# Patient Record
Sex: Female | Born: 1970 | Race: White | Hispanic: No | Marital: Married | Smoking: Current every day smoker
Health system: Southern US, Community
[De-identification: ages and names within clinical notes are randomized; demographics above are authoritative.]

## PROBLEM LIST (undated history)

## (undated) DIAGNOSIS — M1990 Unspecified osteoarthritis, unspecified site: Secondary | ICD-10-CM

## (undated) DIAGNOSIS — R251 Tremor, unspecified: Secondary | ICD-10-CM

## (undated) DIAGNOSIS — Z720 Tobacco use: Secondary | ICD-10-CM

## (undated) DIAGNOSIS — E7800 Pure hypercholesterolemia, unspecified: Secondary | ICD-10-CM

## (undated) DIAGNOSIS — K219 Gastro-esophageal reflux disease without esophagitis: Secondary | ICD-10-CM

## (undated) DIAGNOSIS — I10 Essential (primary) hypertension: Secondary | ICD-10-CM

## (undated) HISTORY — DX: Tremor, unspecified: R25.1

## (undated) HISTORY — PX: ECTOPIC PREGNANCY SURGERY: SHX613

## (undated) HISTORY — DX: Gastro-esophageal reflux disease without esophagitis: K21.9

## (undated) HISTORY — DX: Unspecified osteoarthritis, unspecified site: M19.90

## (undated) HISTORY — DX: Pure hypercholesterolemia, unspecified: E78.00

## (undated) HISTORY — DX: Tobacco use: Z72.0

---

## 1999-10-01 ENCOUNTER — Other Ambulatory Visit: Admission: RE | Admit: 1999-10-01 | Discharge: 1999-10-01 | Payer: Self-pay | Admitting: Obstetrics and Gynecology

## 2000-02-21 ENCOUNTER — Encounter: Payer: Self-pay | Admitting: Emergency Medicine

## 2000-02-21 ENCOUNTER — Emergency Department (HOSPITAL_COMMUNITY): Admission: EM | Admit: 2000-02-21 | Discharge: 2000-02-21 | Payer: Self-pay | Admitting: Emergency Medicine

## 2000-04-18 ENCOUNTER — Ambulatory Visit (HOSPITAL_COMMUNITY): Admission: RE | Admit: 2000-04-18 | Discharge: 2000-04-18 | Payer: Self-pay | Admitting: Internal Medicine

## 2000-10-15 ENCOUNTER — Encounter: Payer: Self-pay | Admitting: Obstetrics & Gynecology

## 2000-10-15 ENCOUNTER — Observation Stay (HOSPITAL_COMMUNITY): Admission: AD | Admit: 2000-10-15 | Discharge: 2000-10-16 | Payer: Self-pay | Admitting: Obstetrics & Gynecology

## 2001-03-16 ENCOUNTER — Other Ambulatory Visit: Admission: RE | Admit: 2001-03-16 | Discharge: 2001-03-16 | Payer: Self-pay | Admitting: Obstetrics and Gynecology

## 2002-07-10 ENCOUNTER — Inpatient Hospital Stay (HOSPITAL_COMMUNITY): Admission: AD | Admit: 2002-07-10 | Discharge: 2002-07-12 | Payer: Self-pay | Admitting: Obstetrics and Gynecology

## 2002-08-12 ENCOUNTER — Other Ambulatory Visit: Admission: RE | Admit: 2002-08-12 | Discharge: 2002-08-12 | Payer: Self-pay | Admitting: Obstetrics & Gynecology

## 2004-02-17 ENCOUNTER — Other Ambulatory Visit: Admission: RE | Admit: 2004-02-17 | Discharge: 2004-02-17 | Payer: Self-pay | Admitting: Obstetrics & Gynecology

## 2005-03-14 ENCOUNTER — Other Ambulatory Visit: Admission: RE | Admit: 2005-03-14 | Discharge: 2005-03-14 | Payer: Self-pay | Admitting: Obstetrics and Gynecology

## 2012-07-26 ENCOUNTER — Ambulatory Visit (INDEPENDENT_AMBULATORY_CARE_PROVIDER_SITE_OTHER): Payer: PRIVATE HEALTH INSURANCE | Admitting: Internal Medicine

## 2012-07-26 VITALS — BP 135/84 | HR 78 | Temp 98.2°F | Resp 16 | Ht 67.0 in | Wt 167.0 lb

## 2012-07-26 DIAGNOSIS — L0100 Impetigo, unspecified: Secondary | ICD-10-CM

## 2012-07-26 MED ORDER — DOXYCYCLINE HYCLATE 100 MG PO TABS: 100.0000 mg | ORAL_TABLET | Freq: Two times a day (BID) | ORAL | Status: AC

## 2012-07-26 NOTE — Progress Notes (Signed)
  Subjective:    Patient ID: Allison Wallace, female    DOB: 06/28/1971, 41 y.o.   MRN: [id number]  HPITender pustules emerging on face over the past week History of acne No mosquito bites or injuries to face No shaving    Review of Systems     Objective:   Physical Exam Pustules x6 on chin Nares clear No vesicles or ulcers       Assessment & Plan:  Problem #1 impetigo face Cultured Doxycycline 100 twice a day for 10 days

## 2012-07-29 LAB — WOUND CULTURE
Gram Stain: NONE SEEN
Organism ID, Bacteria: NO GROWTH

## 2015-08-11 ENCOUNTER — Other Ambulatory Visit: Payer: Self-pay | Admitting: Cardiology

## 2015-08-11 ENCOUNTER — Ambulatory Visit
Admission: RE | Admit: 2015-08-11 | Discharge: 2015-08-11 | Disposition: A | Discharge status: Home / Self Care | Payer: 59 | Source: Ambulatory Visit | Attending: Cardiology | Admitting: Cardiology

## 2015-08-11 DIAGNOSIS — R0789 Other chest pain: Secondary | ICD-10-CM

## 2016-03-02 IMAGING — CR DG CHEST 2V
2 series · 2 of 2 positions shown · non-contrast
Comparison: None.

CLINICAL DATA: Left upper anterior chest and left arm pain for 3
weeks. No injury.

EXAM:
CHEST  2 VIEW

[view not recorded (1 of 2)]
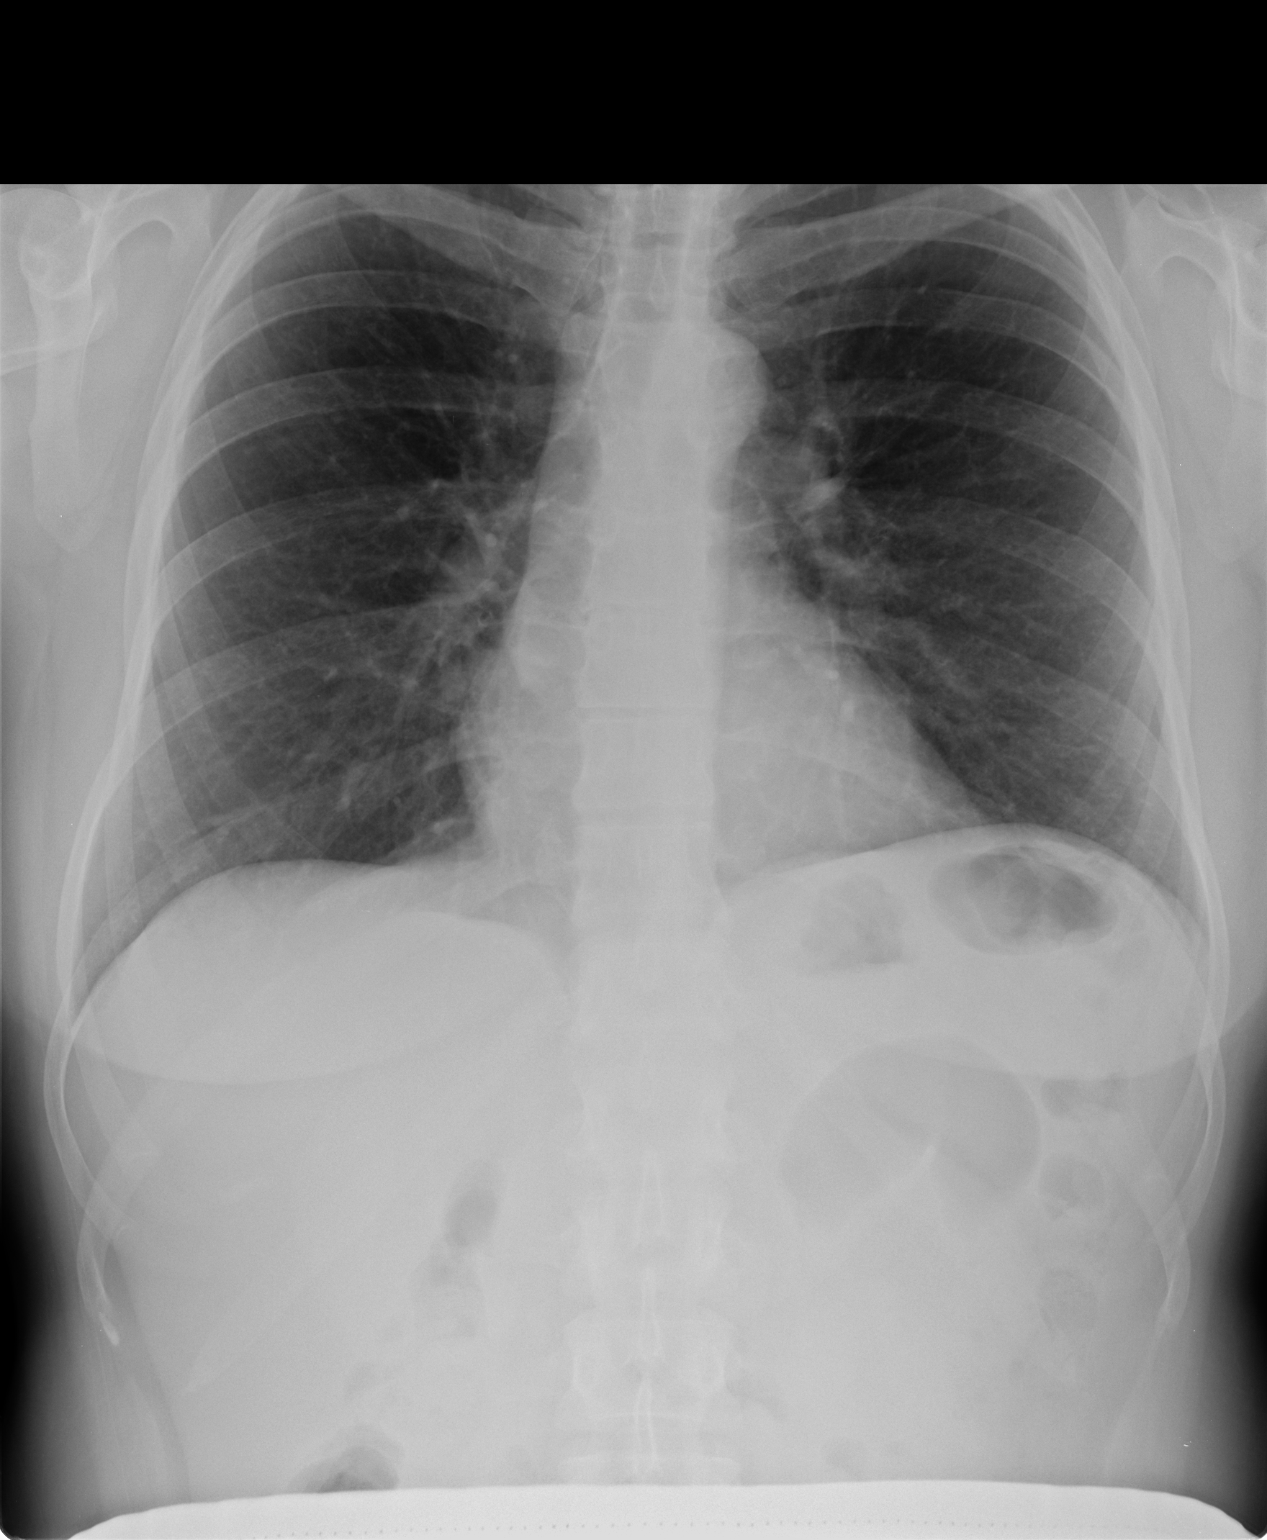

[view not recorded (2 of 2)]
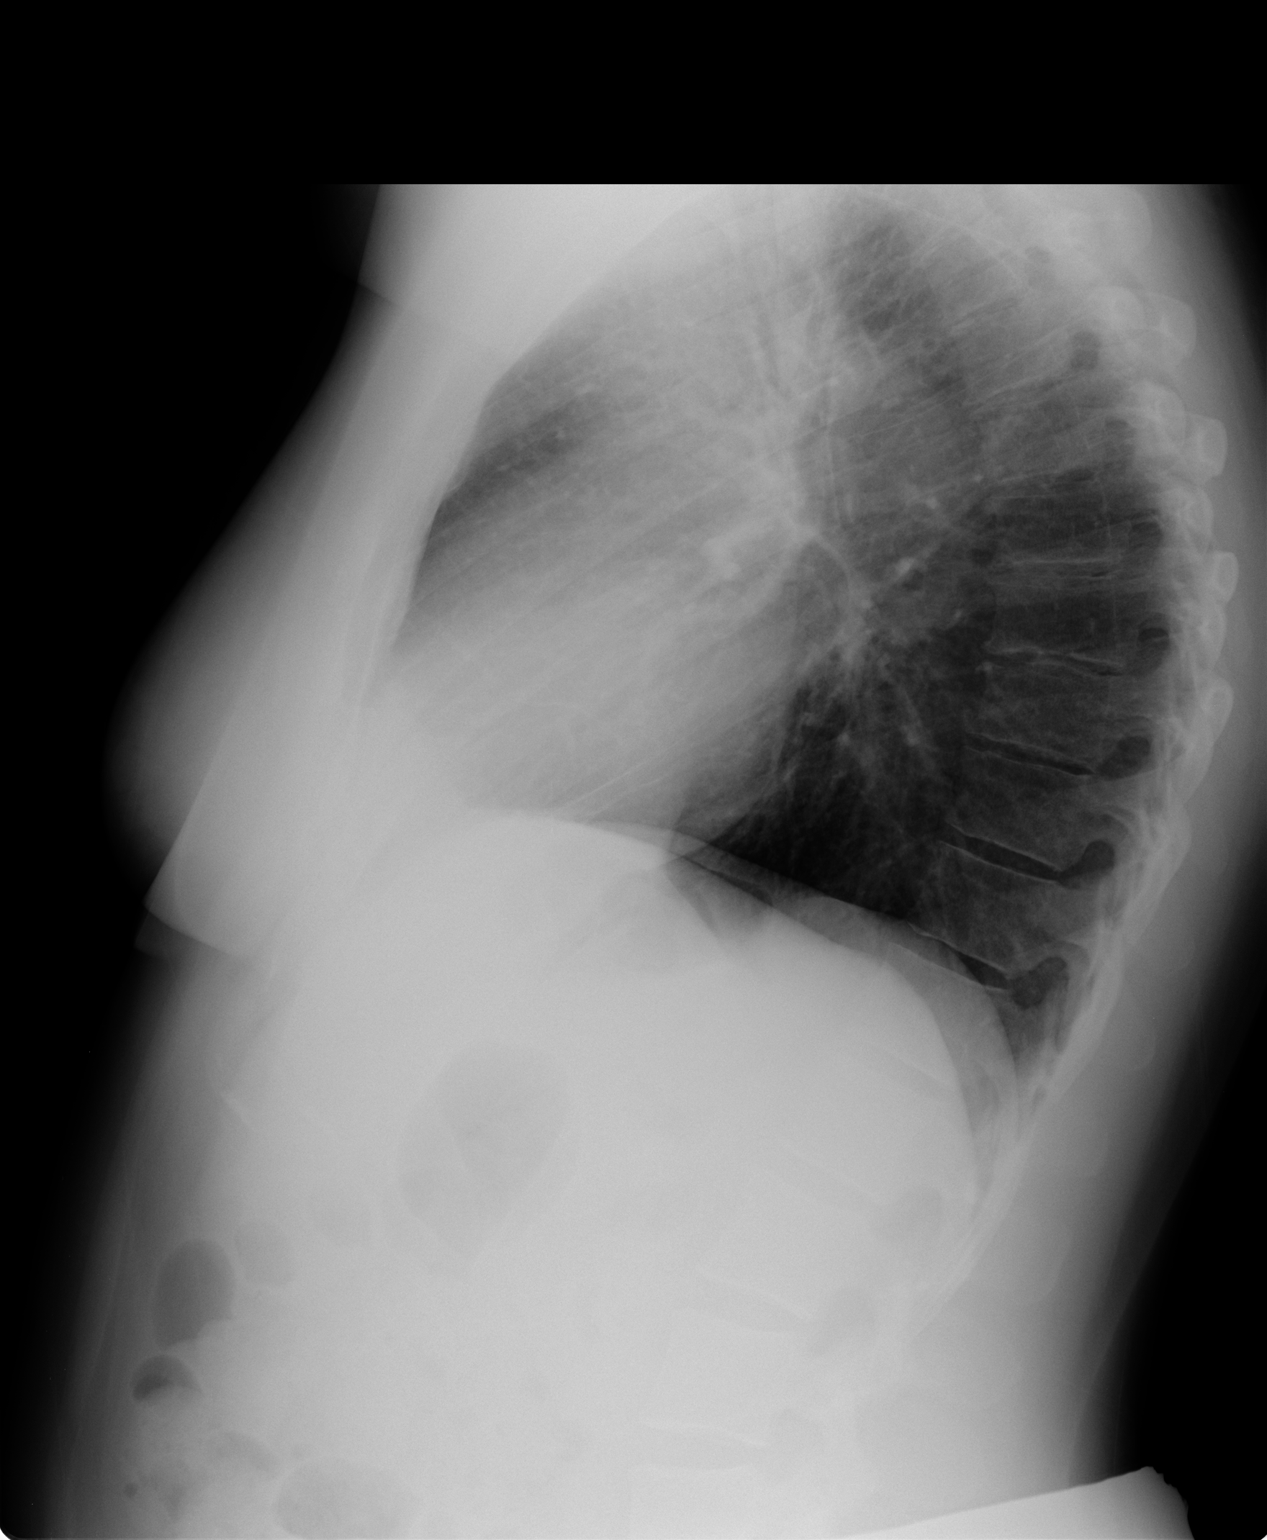

[2 of 2 positions shown; findings below may reference images not displayed]

FINDINGS: Cardiac silhouette normal in size and configuration. Normal
mediastinal and hilar contours. Clear lungs. No pleural effusion or
pneumothorax.

Minimal degenerative spurring along the mid thoracic spine. No other
skeletal abnormality.
IMPRESSION: No active cardiopulmonary disease.

## 2017-05-13 ENCOUNTER — Encounter: Payer: Self-pay | Admitting: Adult Health

## 2017-05-13 ENCOUNTER — Ambulatory Visit (INDEPENDENT_AMBULATORY_CARE_PROVIDER_SITE_OTHER): Payer: 59 | Admitting: Adult Health

## 2017-05-13 VITALS — BP 131/90 | HR 90 | Temp 98.4°F | Ht 67.5 in | Wt 172.3 lb

## 2017-05-13 DIAGNOSIS — F17200 Nicotine dependence, unspecified, uncomplicated: Secondary | ICD-10-CM

## 2017-05-13 DIAGNOSIS — Z0000 Encounter for general adult medical examination without abnormal findings: Secondary | ICD-10-CM | POA: Diagnosis not present

## 2017-05-13 DIAGNOSIS — R5383 Other fatigue: Secondary | ICD-10-CM | POA: Insufficient documentation

## 2017-05-13 DIAGNOSIS — Z8342 Family history of familial hypercholesterolemia: Secondary | ICD-10-CM

## 2017-05-13 NOTE — Assessment & Plan Note (Signed)
Increase water intake to at least 80 ounces/daily. Will check TSH, Vit d levels.

## 2017-05-13 NOTE — Patient Instructions (Signed)
Smoking Tobacco Information Smoking tobacco will very likely harm your health. Tobacco contains a poisonous (toxic), colorless chemical called nicotine. Nicotine affects the brain and makes tobacco addictive. This change in your brain can make it hard to stop smoking. Tobacco also has other toxic chemicals that can hurt your body and raise your risk of many cancers. How can smoking tobacco affect me? Smoking tobacco can increase your chances of having serious health conditions, such as:  Cancer. Smoking is most commonly associated with lung cancer, but can lead to cancer in other parts of the body.  Chronic obstructive pulmonary disease (COPD). This is a long-term lung condition that makes it hard to breathe. It also gets worse over time.  High blood pressure (hypertension), heart disease, stroke, or heart attack.  Lung infections, such as pneumonia.  Cataracts. This is when the lenses in the eyes become clouded.  Digestive problems. This may include peptic ulcers, heartburn, and gastroesophageal reflux disease (GERD).  Oral health problems, such as gum disease and tooth loss.  Loss of taste and smell. Smoking can affect your appearance by causing:  Wrinkles.  Yellow or stained teeth, fingers, and fingernails. Smoking tobacco can also affect your social life.  Many workplaces, [employer], hotels, and public places are tobacco-free. This means that you may experience challenges in finding places to smoke when away from home.  The cost of a smoking habit can be expensive. Expenses for someone who smokes come in two ways:  You spend money on a regular basis to buy tobacco.  Your health care costs in the long-term are higher if you smoke.  Tobacco smoke can also affect the health of those around you. Children of smokers have greater chances of:  Sudden infant death syndrome (SIDS).  Ear infections.  Lung infections. What lifestyle changes can be made?  Do not start smoking.  Quit if you already do.  To quit smoking:  Make a plan to quit smoking and commit yourself to it. Look for programs to help you and ask your health care provider for recommendations and ideas.  Talk with your health care provider about using nicotine replacement medicines to help you quit. Medicine replacement medicines include gum, lozenges, patches, sprays, or pills.  Do not replace cigarette smoking with electronic cigarettes, which are commonly called e-cigarettes. The safety of e-cigarettes is not known, and some may contain harmful chemicals.  Avoid places, people, or situations that tempt you to smoke.  If you try to quit but return to smoking, don't give up hope. It is very common for people to try a number of times before they fully succeed. When you feel ready again, give it another try.  Quitting smoking might affect the way you eat as well as your weight. Be prepared to monitor your eating habits. Get support in planning and following a healthy diet.  Ask your health care provider about having regular tests (screenings) to check for cancer. This may include blood tests, imaging tests, and other tests.  Exercise regularly. Consider taking walks, joining a gym, or doing yoga or exercise classes.  Develop skills to manage your stress. These skills include meditation. What are the benefits of quitting smoking? By quitting smoking, you may:  Lower your risk of getting cancer and other diseases caused by smoking.  Live longer.  Breathe better.  Lower your blood pressure and heart rate.  Stop your addiction to tobacco.  Stop creating secondhand smoke that hurts other people.  Improve your sense of taste  and smell.  Look better over time, due to having fewer wrinkles and less staining. What can happen if changes are not made? If you do not stop smoking, you may:  Get cancer and other diseases.  Develop COPD or other long-term (chronic) lung conditions.  Develop  serious problems with your heart and blood vessels (cardiovascular system).  Need more tests to screen for problems caused by smoking.  Have higher, long-term healthcare costs from medicines or treatments related to smoking.  Continue to have worsening changes in your lungs, mouth, and nose. Where to find support: To get support to quit smoking, consider:  Asking your health care provider for more information and resources.  Taking classes to learn more about quitting smoking.  Looking for local organizations that offer resources about quitting smoking.  Joining a support group for people who want to quit smoking in your local community. Where to find more information: You may find more information about quitting smoking from:  HelpGuide.org: www.helpguide.org/articles/addictions/how-to-quit-smoking.htm  https://hall.com/: smokefree.gov  American Lung Association: www.lung.org Contact a health care provider if:  You have problems breathing.  Your lips, nose, or fingers turn blue.  You have chest pain.  You are coughing up blood.  You feel faint or you pass out.  You have other noticeable changes that cause you to worry. Summary  Smoking tobacco can negatively affect your health, the health of those around you, your finances, and your social life.  Do not start smoking. Quit if you already do. If you need help quitting, ask your health care provider.  Think about joining a support group for people who want to quit smoking in your local community. There are many effective programs that will help you to quit this behavior. This information is not intended to replace advice given to you by your health care provider. Make sure you discuss any questions you have with your health care provider. Document Released: 12/31/2016 Document Revised: 12/31/2016 Document Reviewed: 12/31/2016 Elsevier Interactive Patient Education  2017 [employer].  Please consider quitting tobacco  use. Increase water intake to at least 80 ounces daily. Increase daily movement to at least 59mins/5 times a week. Please schedule fasting labs and complete physical examination at your convenience.  Annual follow-up, sooner if needed.

## 2017-05-13 NOTE — Progress Notes (Signed)
Subjective:    Patient ID: Allison Wallace, female    DOB: [birth year], 46 y.o.   MRN: [id number]  HPI:  [name] presents to establish as a new pt.  She is a very pleasant 46 year old female.  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 She is married and has two boys ages 18 and 3.  She estimates to drink 1-3 glasses of wine a night.  She denies any acute complaints. She was recently followed by Dr. Pietro Cassis Care.   Annual/PRN Dermatology care by Dr. Nevada Crane. Of Note: last mammogram 2017 that showed "cystic breasts".  She plans on following up with GYN for f/u mammogram.  No care team member to display  There are no active problems to display for this patient.    History reviewed. No pertinent past medical history.   History reviewed. No pertinent surgical history.   Family History  Problem Relation Age of Onset  . Hyperlipidemia Father      History  Drug Use No     History  Alcohol Use  . 2.4 oz/week  . 4 Glasses of wine per week     History  Smoking Status  . Current Every Day Smoker  . Types: Cigarettes  Smokeless Tobacco  . Current User    Comment: 1 pack every 2 days     Outpatient Encounter Prescriptions as of 05/13/2017  Medication Sig  . Ivermectin (SOOLANTRA) 1 % CREA Apply topically.  . Minocycline HCl (SOLODYN) 115 MG TB24 Solodyn 115 mg tablet,extended release  TAKE 1 TABLET BY MOUTH EVERY DAY WITH FOOD AND WATER, (SUN WARNING)  . Multiple Vitamin (ONE-A-DAY ADULT VITACRAVES+DHA) CHEW Chew by mouth.  . [DISCONTINUED] vitamin A 10000 UNIT capsule Take 10,000 Units by mouth daily.   No facility-administered encounter medications on file as of 05/13/2017.     Allergies: Patient has no known allergies.  Body mass index is 26.59 kg/m.  Blood pressure 131/90, pulse 90, temperature 98.4 F (36.9  C), height 5' 7.5" (1.715 m), weight 172 lb 4.8 oz (78.2 kg), SpO2 97 %.  Review of Systems  Constitutional: Positive for fatigue. Negative for activity change, appetite change, chills, diaphoresis, fever and unexpected weight change.  HENT: Negative for congestion.   Eyes: Negative for visual disturbance.  Respiratory: Negative for cough, chest tightness, shortness of breath, wheezing and stridor.   Cardiovascular: Negative for chest pain, palpitations and leg swelling.  Gastrointestinal: Negative for abdominal distention, abdominal pain, blood in stool, constipation, diarrhea, nausea and vomiting.  Endocrine: Negative for cold intolerance, heat intolerance, polydipsia, polyphagia and polyuria.  Genitourinary: Negative for difficulty urinating, flank pain and hematuria.  Musculoskeletal: Negative for arthralgias, back pain, gait problem, joint swelling, myalgias, neck pain and neck stiffness.  Skin: Negative for color change, pallor, rash and wound.  Allergic/Immunologic: Negative for immunocompromised state.  Neurological: Negative for dizziness, tremors, weakness and headaches.  Hematological: Does not bruise/bleed easily.  Psychiatric/Behavioral: Negative for agitation, dysphoric mood and sleep disturbance. The patient is not nervous/anxious.        Objective:   Physical Exam  Constitutional: She is oriented to person, place, and time. She appears well-developed and well-nourished. No distress.  HENT:  Head: Normocephalic and atraumatic.  Right Ear: Hearing, tympanic membrane, external ear and ear canal normal. No decreased hearing is noted.  Left Ear: Tympanic membrane, external  ear and ear canal normal. No decreased hearing is noted.  Nose: Nose normal. Right sinus exhibits no maxillary sinus tenderness and no frontal sinus tenderness. Left sinus exhibits no maxillary sinus tenderness and no frontal sinus tenderness.  Mouth/Throat: Uvula is midline.  Eyes: Conjunctivae are normal.  Pupils are equal, round, and reactive to light.  Neck: Normal range of motion. Neck supple.  Cardiovascular: Normal rate, regular rhythm, normal heart sounds and intact distal pulses.   No murmur heard. Pulmonary/Chest: Effort normal and breath sounds normal. No respiratory distress. She has no wheezes. She has no rales. She exhibits no tenderness.  Abdominal: Soft. Bowel sounds are normal. She exhibits no distension and no mass. There is no tenderness. There is no rebound and no guarding.  Musculoskeletal: Normal range of motion.  Lymphadenopathy:    She has no cervical adenopathy.  Neurological: She is alert and oriented to person, place, and time. Coordination normal.  Skin: Skin is warm and dry. Rash noted. Rash is nodular. She is not diaphoretic. No erythema. No pallor. Nails show clubbing.  Multiple discrete lipomas on bil upper extremities.   Psychiatric: She has a normal mood and affect. Her behavior is normal. Judgment and thought content normal.  Nursing note and vitals reviewed.         Assessment & Plan:   1. Family history of high cholesterol   2. Fatigue, unspecified type   3. Health care maintenance   4. Tobacco use disorder   5. Healthcare maintenance     Family history of high cholesterol Will check fasting lipid panel. Follow heart healthy diet.  Fatigue Increase water intake to at least 80 ounces/daily. Will check TSH, Vit d levels.  Healthcare maintenance Increase water intake to at least 80 ounces/daily. Increase regular movement to at least 35mins/5 times daily. Declined tobacco cessation today. Decrease ETOH use to max 1 glass wine/day. Follow Heart Healthy Diet Please schedule fasting labs and CPE at your convenience.     FOLLOW-UP:  Return in about 1 year (around 05/13/2018) for Regular Follow Up.

## 2017-05-13 NOTE — Assessment & Plan Note (Signed)
Will check fasting lipid panel. Follow heart healthy diet.

## 2017-05-13 NOTE — Assessment & Plan Note (Signed)
Increase water intake to at least 80 ounces/daily. Increase regular movement to at least 60mins/5 times daily. Declined tobacco cessation today. Decrease ETOH use to max 1 glass wine/day. Follow Heart Healthy Diet Please schedule fasting labs and CPE at your convenience.

## 2018-06-29 ENCOUNTER — Other Ambulatory Visit: Payer: Managed Care, Other (non HMO)

## 2018-06-29 DIAGNOSIS — Z0000 Encounter for general adult medical examination without abnormal findings: Secondary | ICD-10-CM

## 2018-06-30 LAB — CBC WITH DIFFERENTIAL/PLATELET
BASOS: 0 %
Basophils Absolute: 0 10*3/uL (ref 0.0–0.2)
EOS (ABSOLUTE): 0.1 10*3/uL (ref 0.0–0.4)
EOS: 1 %
HEMOGLOBIN: 16.2 g/dL — AB (ref 11.1–15.9)
Hematocrit: 46.5 % (ref 34.0–46.6)
IMMATURE GRANS (ABS): 0 10*3/uL (ref 0.0–0.1)
IMMATURE GRANULOCYTES: 0 %
LYMPHS ABS: 2 10*3/uL (ref 0.7–3.1)
Lymphs: 26 %
MCH: 37 pg — AB (ref 26.6–33.0)
MCHC: 34.8 g/dL (ref 31.5–35.7)
MCV: 106 fL — AB (ref 79–97)
MONOS ABS: 0.6 10*3/uL (ref 0.1–0.9)
Monocytes: 8 %
NEUTROS ABS: 5.1 10*3/uL (ref 1.4–7.0)
Neutrophils: 65 %
Platelets: 286 10*3/uL (ref 150–450)
RBC: 4.38 x10E6/uL (ref 3.77–5.28)
RDW: 13 % (ref 12.3–15.4)
WBC: 7.9 10*3/uL (ref 3.4–10.8)

## 2018-06-30 LAB — COMPREHENSIVE METABOLIC PANEL
ALK PHOS: 81 IU/L (ref 39–117)
ALT: 20 IU/L (ref 0–32)
AST: 21 IU/L (ref 0–40)
Albumin/Globulin Ratio: 1.7 (ref 1.2–2.2)
Albumin: 3.9 g/dL (ref 3.5–5.5)
BILIRUBIN TOTAL: 0.5 mg/dL (ref 0.0–1.2)
BUN/Creatinine Ratio: 11 (ref 9–23)
BUN: 9 mg/dL (ref 6–24)
CHLORIDE: 109 mmol/L — AB (ref 96–106)
CO2: 24 mmol/L (ref 20–29)
CREATININE: 0.8 mg/dL (ref 0.57–1.00)
Calcium: 9.1 mg/dL (ref 8.7–10.2)
GFR calc Af Amer: 102 mL/min/{1.73_m2} (ref >59)
GFR calc non Af Amer: 88 mL/min/{1.73_m2} (ref >59)
GLUCOSE: 77 mg/dL (ref 65–99)
Globulin, Total: 2.3 g/dL (ref 1.5–4.5)
Potassium: 4.6 mmol/L (ref 3.5–5.2)
SODIUM: 144 mmol/L (ref 134–144)
Total Protein: 6.2 g/dL (ref 6.0–8.5)

## 2018-06-30 LAB — LIPID PANEL
Chol/HDL Ratio: 3.8 ratio (ref 0.0–4.4)
Cholesterol, Total: 189 mg/dL (ref 100–199)
HDL: 50 mg/dL (ref >39)
LDL CALC: 116 mg/dL — AB (ref 0–99)
TRIGLYCERIDES: 117 mg/dL (ref 0–149)
VLDL CHOLESTEROL CAL: 23 mg/dL (ref 5–40)

## 2018-06-30 LAB — HEMOGLOBIN A1C
ESTIMATED AVERAGE GLUCOSE: 94 mg/dL
Hgb A1c MFr Bld: 4.9 % (ref 4.8–5.6)

## 2018-06-30 LAB — TSH: TSH: 1.73 u[IU]/mL (ref 0.450–4.500)

## 2018-07-06 ENCOUNTER — Encounter: Payer: Self-pay | Admitting: Adult Health

## 2018-07-06 ENCOUNTER — Ambulatory Visit (INDEPENDENT_AMBULATORY_CARE_PROVIDER_SITE_OTHER): Payer: Managed Care, Other (non HMO) | Admitting: Adult Health

## 2018-07-06 VITALS — BP 138/85 | HR 85 | Ht 67.5 in | Wt 171.6 lb

## 2018-07-06 DIAGNOSIS — E7800 Pure hypercholesterolemia, unspecified: Secondary | ICD-10-CM | POA: Diagnosis not present

## 2018-07-06 DIAGNOSIS — R7989 Other specified abnormal findings of blood chemistry: Secondary | ICD-10-CM | POA: Insufficient documentation

## 2018-07-06 DIAGNOSIS — D179 Benign lipomatous neoplasm, unspecified: Secondary | ICD-10-CM | POA: Diagnosis not present

## 2018-07-06 DIAGNOSIS — E782 Mixed hyperlipidemia: Secondary | ICD-10-CM

## 2018-07-06 DIAGNOSIS — Z8342 Family history of familial hypercholesterolemia: Secondary | ICD-10-CM | POA: Diagnosis not present

## 2018-07-06 DIAGNOSIS — Z0000 Encounter for general adult medical examination without abnormal findings: Secondary | ICD-10-CM | POA: Diagnosis not present

## 2018-07-06 DIAGNOSIS — Z23 Encounter for immunization: Secondary | ICD-10-CM

## 2018-07-06 DIAGNOSIS — R251 Tremor, unspecified: Secondary | ICD-10-CM | POA: Diagnosis not present

## 2018-07-06 HISTORY — DX: Mixed hyperlipidemia: E78.2

## 2018-07-06 NOTE — Assessment & Plan Note (Signed)
Stable If sx's worsen or affect daily life- call clinic

## 2018-07-06 NOTE — Assessment & Plan Note (Addendum)
7/19 Hemoglobin- 16.2 MCV- 106 MCH- 37 She reports hx of anemia during pregnancy, denies excessive fatigue currently Will repeat CBC in 3 months

## 2018-07-06 NOTE — Assessment & Plan Note (Signed)
Increase water intake, strive for at least 85 ounces/day.   Follow Heart Healthy diet Increase regular exercise.  Recommend at least 30 minutes daily, 5 days per week of walking, jogging, biking, swimming, YouTube/Pinterest workout videos. Your recent labs look good, recommend repeating CBC in 3 months to check for anemia. Referral to General Surgery placed, re: enlarging upper extremity lipomas. Reduce tobacco and alcohol use. Please schedule lab appt in 3 months, re: CBC repeat Please schedule complete physical with fasting labs in 1 year.

## 2018-07-06 NOTE — Progress Notes (Signed)
Subjective:    Patient ID: Allison Wallace, female    DOB: 10/26/1971, 47 y.o.   MRN: [id number]  HPI:  05/13/17 OV: Allison Wallace presents to establish as a new pt.  Allison Wallace is a very pleasant 47 year old female.  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 Allison Wallace is married and has two boys ages 6 and 103.  Allison Wallace estimates to drink 1-3 glasses of wine a night.  Allison Wallace denies any acute complaints. Allison Wallace was recently followed by Dr. Pietro Cassis Care.   Annual/PRN Dermatology care by Dr. Nevada Crane. Of Note: last mammogram 2017 that showed "cystic breasts".  Allison Wallace plans on following up with GYN for f/u mammogram.  07/06/18 OV: Allison Wallace presents for CPE Allison Wallace estimates to drink 16-20 oz water/day, prefers to hydrate with sweat tea. Allison Wallace denies regular exercise, however has [employer] Allison Wallace reports walking frequently at work. Allison Wallace continues to use tobacco- pack/day, declined smoking cessation today Allison Wallace reports increase in size/nnumber of upper extremity lipomas Allison Wallace reports tremor of head and LLE for "at least several years" and reports an aunt and her son with similar sx's Allison Wallace reports drinking 3-4 glasses wine/night Allison Wallace denies ETOH interfering with work responsibilities or interpersonal relationships. Allison Wallace denies hx of DUIs  Reviewed most recent labs, notables: LDL- 116 CBC- minor bump in hemoglobin, MCV, MCH  Healthcare Maintenance: PAP-UTD, will request from OB/GYN Mammogram- pt will schedule Immunizations- Tdap updated today  Patient Care Team    Relationship Specialty Notifications Start End  Mina Marble D, NP PCP - General Family Medicine  05/13/17   Allyn Kenner, MD  Dermatology  05/13/17   Olga Millers, MD Consulting Physician Obstetrics and Gynecology  05/13/17     Patient Active Problem List   Diagnosis Date Noted  . Elevated LDL cholesterol level  07/06/2018  . Abnormal CBC 07/06/2018  . 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 Tremor of unknown origin 07/06/2018  . Family history of high cholesterol 05/13/2017  . Fatigue 05/13/2017  . Healthcare maintenance 05/13/2017  . Tobacco use disorder 05/13/2017     History reviewed. No pertinent past medical history.   History reviewed. No pertinent surgical history.   Family History  Problem Relation Age of Onset  . Hyperlipidemia Father      Social History   Substance and Sexual Activity  Drug Use No     Social History   Substance and Sexual Activity  Alcohol Use Yes  . Alcohol/week: 2.4 oz  . Types: 4 Glasses of wine per week     Social History   Tobacco Use  Smoking Status Current Every Day Smoker  . Types: Cigarettes  Smokeless Tobacco Current User  Tobacco Comment   1 pack every 2 days     Outpatient Encounter Medications as of 07/06/2018  Medication Sig  . Ivermectin (SOOLANTRA) 1 % CREA Apply topically.  . Minocycline HCl (SOLODYN) 115 MG TB24 Solodyn 115 mg tablet,extended release  TAKE 1 TABLET BY MOUTH EVERY DAY WITH FOOD AND WATER, (SUN WARNING)  . Multiple Vitamin (ONE-A-DAY ADULT VITACRAVES+DHA) CHEW Chew by mouth.   No facility-administered encounter medications on file as of 07/06/2018.     Allergies: Patient has no known allergies.  Body mass index is 26.48 kg/m.  Blood pressure 138/85, pulse 85, height 5' 7.5" (1.715 m), weight 171 lb 9.6 oz (77.8 kg), SpO2 98 %.  Review of Systems  Constitutional: Positive for fatigue. Negative for activity change, appetite change, chills, diaphoresis, fever and unexpected weight change.  HENT: Negative for congestion.   Eyes: Negative for visual disturbance.  Respiratory: Negative for cough, chest tightness, shortness of breath, wheezing and stridor.   Cardiovascular: Negative for chest pain, palpitations and leg swelling.  Gastrointestinal: Negative for abdominal distention, abdominal pain, blood in  stool, constipation, diarrhea, nausea and vomiting.  Endocrine: Negative for cold intolerance, heat intolerance, polydipsia, polyphagia and polyuria.  Genitourinary: Negative for difficulty urinating, flank pain and hematuria.  Musculoskeletal: Negative for arthralgias, back pain, gait problem, joint swelling, myalgias, neck pain and neck stiffness.  Skin: Negative for color change, pallor, rash and wound.       Increase in size/number of upper extremity lipomas  Allergic/Immunologic: Negative for immunocompromised state.  Neurological: Negative for dizziness, tremors, weakness and headaches.  Hematological: Does not bruise/bleed easily.  Psychiatric/Behavioral: Negative for agitation, dysphoric mood and sleep disturbance. The patient is not nervous/anxious.        Objective:   Physical Exam  Constitutional: Allison Wallace is oriented to person, place, and time. Allison Wallace appears well-developed and well-nourished. No distress.  HENT:  Head: Normocephalic and atraumatic.  Right Ear: Hearing, tympanic membrane, external ear and ear canal normal. No decreased hearing is noted.  Left Ear: Tympanic membrane, external ear and ear canal normal. No decreased hearing is noted.  Nose: Nose normal. Right sinus exhibits no maxillary sinus tenderness and no frontal sinus tenderness. Left sinus exhibits no maxillary sinus tenderness and no frontal sinus tenderness.  Mouth/Throat: Uvula is midline.  Eyes: Pupils are equal, round, and reactive to light. Conjunctivae are normal.  Neck: Normal range of motion. Neck supple.  Cardiovascular: Normal rate, regular rhythm, normal heart sounds and intact distal pulses.  No murmur heard. Pulmonary/Chest: Effort normal and breath sounds normal. No respiratory distress. Allison Wallace has no decreased breath sounds. Allison Wallace has no wheezes. Allison Wallace has no rhonchi. Allison Wallace has no rales. Allison Wallace exhibits no tenderness. Right breast exhibits no inverted nipple, no mass, no nipple discharge, no skin change and no  tenderness. Left breast exhibits no inverted nipple, no mass, no nipple discharge, no skin change and no tenderness.  Abdominal: Soft. Bowel sounds are normal. Allison Wallace exhibits no distension and no mass. There is no tenderness. There is no rebound and no guarding.  Musculoskeletal: Normal range of motion. Allison Wallace exhibits no edema or tenderness.  Lymphadenopathy:    Allison Wallace has no cervical adenopathy.  Neurological: Allison Wallace is alert and oriented to person, place, and time. Allison Wallace displays tremor. Coordination and gait normal.  Fine tremor of head and LLE   Skin: Skin is warm, dry and intact. Rash noted. Allison Wallace is not diaphoretic. No erythema. No pallor. Nails show clubbing.  Multiple discrete lipomas on bil upper extremities.   Psychiatric: Allison Wallace has a normal mood and affect. Her behavior is normal. Judgment and thought content normal.  Nursing note and vitals reviewed.     Assessment & Plan:   1. Healthcare maintenance   2. Family history of high cholesterol   3. Elevated LDL cholesterol level   4. Abnormal CBC   5. Need for Tdap vaccination   6. Multiple lipomas   7. Tremor of unknown origin     Abnormal CBC 7/19 Hemoglobin- 16.2 MCV- 106 MCH- 37 Allison Wallace reports hx of anemia during pregnancy, denies excessive fatigue currently Will repeat CBC in 3 months   Healthcare maintenance Increase water intake, strive for at least 85  ounces/day.   Follow Heart Healthy diet Increase regular exercise.  Recommend at least 30 minutes daily, 5 days per week of walking, jogging, biking, swimming, YouTube/Pinterest workout videos. Your recent labs look good, recommend repeating CBC in 3 months to check for anemia. Referral to General Surgery placed, re: enlarging upper extremity lipomas. Reduce tobacco and alcohol use. Please schedule lab appt in 3 months, re: CBC repeat Please schedule complete physical with fasting labs in 1 year.  Tremor of unknown origin Stable If sx's worsen or affect daily life- call  clinic  FOLLOW-UP:  Return in about 1 year (around 07/07/2019) for CPE, Fasting Labs.

## 2018-07-06 NOTE — Patient Instructions (Addendum)
Preventive Care for Adults, Female  A healthy lifestyle and preventive care can promote health and wellness. Preventive health guidelines for women include the following key practices.   A routine yearly physical is a good way to check with your health care provider about your health and preventive screening. It is a chance to share any concerns and updates on your health and to receive a thorough exam.   Visit your dentist for a routine exam and preventive care every 6 months. Brush your teeth twice a day and floss once a day. Good oral hygiene prevents tooth decay and gum disease.   The frequency of eye exams is based on your age, health, family medical history, use of contact lenses, and other factors. Follow your health care provider's recommendations for frequency of eye exams.   Eat a healthy diet. Foods like vegetables, fruits, whole grains, low-fat dairy products, and lean protein foods contain the nutrients you need without too many calories. Decrease your intake of foods high in solid fats, added sugars, and salt. Eat the right amount of calories for you.Get information about a proper diet from your health care provider, if necessary.   Regular physical exercise is one of the most important things you can do for your health. Most adults should get at least 150 minutes of moderate-intensity exercise (any activity that increases your heart rate and causes you to sweat) each week. In addition, most adults need muscle-strengthening exercises on 2 or more days a week.   Maintain a healthy weight. The body mass index (BMI) is a screening tool to identify possible weight problems. It provides an estimate of body fat based on height and weight. Your health care provider can find your BMI, and can help you achieve or maintain a healthy weight.For adults 20 years and older:   - A BMI below 18.5 is considered underweight.   - A BMI of 18.5 to 24.9 is normal.   - A BMI of 25 to 29.9 is  considered overweight.   - A BMI of 30 and above is considered obese.   Maintain normal blood lipids and cholesterol levels by exercising and minimizing your intake of trans and saturated fats.  Eat a balanced diet with plenty of fruit and vegetables. Blood tests for lipids and cholesterol should begin at age 20 and be repeated every 5 years minimum.  If your lipid or cholesterol levels are high, you are over 40, or you are at high risk for heart disease, you may need your cholesterol levels checked more frequently.Ongoing high lipid and cholesterol levels should be treated with medicines if diet and exercise are not working.   If you smoke, find out from your health care provider how to quit. If you do not use tobacco, do not start.   Lung cancer screening is recommended for adults aged 55-80 years who are at high risk for developing lung cancer because of a history of smoking. A yearly low-dose CT scan of the lungs is recommended for people who have at least a 30-pack-year history of smoking and are a current smoker or have quit within the past 15 years. A pack year of smoking is smoking an average of 1 pack of cigarettes a day for 1 year (for example: 1 pack a day for 30 years or 2 packs a day for 15 years). Yearly screening should continue until the smoker has stopped smoking for at least 15 years. Yearly screening should be stopped for people who develop a   health problem that would prevent them from having lung cancer treatment.   If you are pregnant, do not drink alcohol. If you are breastfeeding, be very cautious about drinking alcohol. If you are not pregnant and choose to drink alcohol, do not have more than 1 drink per day. One drink is considered to be 12 ounces (355 mL) of beer, 5 ounces (148 mL) of wine, or 1.5 ounces (44 mL) of liquor.   Avoid use of street drugs. Do not share needles with anyone. Ask for help if you need support or instructions about stopping the use of  drugs.   High blood pressure causes heart disease and increases the risk of stroke. Your blood pressure should be checked at least yearly.  Ongoing high blood pressure should be treated with medicines if weight loss and exercise do not work.   If you are 69-55 years old, ask your health care provider if you should take aspirin to prevent strokes.   Diabetes screening involves taking a blood sample to check your fasting blood sugar level. This should be done once every 3 years, after age 38, if you are within normal weight and without risk factors for diabetes. Testing should be considered at a younger age or be carried out more frequently if you are overweight and have at least 1 risk factor for diabetes.   Breast cancer screening is essential preventive care for women. You should practice "breast self-awareness."  This means understanding the normal appearance and feel of your breasts and may include breast self-examination.  Any changes detected, no matter how small, should be reported to a health care provider.  Women in their 80s and 30s should have a clinical breast exam (CBE) by a health care provider as part of a regular health exam every 1 to 3 years.  After age 66, women should have a CBE every year.  Starting at age 1, women should consider having a mammogram (breast X-ray test) every year.  Women who have a family history of breast cancer should talk to their health care provider about genetic screening.  Women at a high risk of breast cancer should talk to their health care providers about having an MRI and a mammogram every year.   -Breast cancer gene (BRCA)-related cancer risk assessment is recommended for women who have family members with BRCA-related cancers. BRCA-related cancers include breast, ovarian, tubal, and peritoneal cancers. Having family members with these cancers may be associated with an increased risk for harmful changes (mutations) in the breast cancer genes BRCA1 and  BRCA2. Results of the assessment will determine the need for genetic counseling and BRCA1 and BRCA2 testing.   The Pap test is a screening test for cervical cancer. A Pap test can show cell changes on the cervix that might become cervical cancer if left untreated. A Pap test is a procedure in which cells are obtained and examined from the lower end of the uterus (cervix).   - Women should have a Pap test starting at age 57.   - Between ages 90 and 70, Pap tests should be repeated every 2 years.   - Beginning at age 63, you should have a Pap test every 3 years as long as the past 3 Pap tests have been normal.   - Some women have medical problems that increase the chance of getting cervical cancer. Talk to your health care provider about these problems. It is especially important to talk to your health care provider if a  new problem develops soon after your last Pap test. In these cases, your health care provider may recommend more frequent screening and Pap tests.   - The above recommendations are the same for women who have or have not gotten the vaccine for human papillomavirus (HPV).   - If you had a hysterectomy for a problem that was not cancer or a condition that could lead to cancer, then you no longer need Pap tests. Even if you no longer need a Pap test, a regular exam is a good idea to make sure no other problems are starting.   - If you are between ages 36 and 66 years, and you have had normal Pap tests going back 10 years, you no longer need Pap tests. Even if you no longer need a Pap test, a regular exam is a good idea to make sure no other problems are starting.   - If you have had past treatment for cervical cancer or a condition that could lead to cancer, you need Pap tests and screening for cancer for at least 20 years after your treatment.   - If Pap tests have been discontinued, risk factors (such as a new sexual partner) need to be reassessed to determine if screening should  be resumed.   - The HPV test is an additional test that may be used for cervical cancer screening. The HPV test looks for the virus that can cause the cell changes on the cervix. The cells collected during the Pap test can be tested for HPV. The HPV test could be used to screen women aged 70 years and older, and should be used in women of any age who have unclear Pap test results. After the age of 67, women should have HPV testing at the same frequency as a Pap test.   Colorectal cancer can be detected and often prevented. Most routine colorectal cancer screening begins at the age of 57 years and continues through age 26 years. However, your health care provider may recommend screening at an earlier age if you have risk factors for colon cancer. On a yearly basis, your health care provider may provide home test kits to check for hidden blood in the stool.  Use of a small camera at the end of a tube, to directly examine the colon (sigmoidoscopy or colonoscopy), can detect the earliest forms of colorectal cancer. Talk to your health care provider about this at age 23, when routine screening begins. Direct exam of the colon should be repeated every 5 -10 years through age 49 years, unless early forms of pre-cancerous polyps or small growths are found.   People who are at an increased risk for hepatitis B should be screened for this virus. You are considered at high risk for hepatitis B if:  -You were born in a country where hepatitis B occurs often. Talk with your health care provider about which countries are considered high risk.  - Your parents were born in a high-risk country and you have not received a shot to protect against hepatitis B (hepatitis B vaccine).  - You have HIV or AIDS.  - You use needles to inject street drugs.  - You live with, or have sex with, someone who has Hepatitis B.  - You get hemodialysis treatment.  - You take certain medicines for conditions like cancer, organ  transplantation, and autoimmune conditions.   Hepatitis C blood testing is recommended for all people born from [birth year] through 1965 and any individual  with known risks for hepatitis C.   Practice safe sex. Use condoms and avoid high-risk sexual practices to reduce the spread of sexually transmitted infections (STIs). STIs include gonorrhea, chlamydia, syphilis, trichomonas, herpes, HPV, and human immunodeficiency virus (HIV). Herpes, HIV, and HPV are viral illnesses that have no cure. They can result in disability, cancer, and death. Sexually active women aged 25 years and younger should be checked for chlamydia. Older women with new or multiple partners should also be tested for chlamydia. Testing for other STIs is recommended if you are sexually active and at increased risk.   Osteoporosis is a disease in which the bones lose minerals and strength with aging. This can result in serious bone fractures or breaks. The risk of osteoporosis can be identified using a bone density scan. Women ages 65 years and over and women at risk for fractures or osteoporosis should discuss screening with their health care providers. Ask your health care provider whether you should take a calcium supplement or vitamin D to There are also several preventive steps women can take to avoid osteoporosis and resulting fractures or to keep osteoporosis from worsening. -->Recommendations include:  Eat a balanced diet high in fruits, vegetables, calcium, and vitamins.  Get enough calcium. The recommended total intake of is 1,200 mg daily; for best absorption, if taking supplements, divide doses into 250-500 mg doses throughout the day. Of the two types of calcium, calcium carbonate is best absorbed when taken with food but calcium citrate can be taken on an empty stomach.  Get enough vitamin D. NAMS and the National Osteoporosis Foundation recommend at least 1,000 IU per day for women age 50 and over who are at risk of vitamin D  deficiency. Vitamin D deficiency can be caused by inadequate sun exposure (for example, those who live in northern latitudes).  Avoid alcohol and smoking. Heavy alcohol intake (more than 7 drinks per week) increases the risk of falls and hip fracture and women smokers tend to lose bone more rapidly and have lower bone mass than nonsmokers. Stopping smoking is one of the most important changes women can make to improve their health and decrease risk for disease.  Be physically active every day. Weight-bearing exercise (for example, fast walking, hiking, jogging, and weight training) may strengthen bones or slow the rate of bone loss that comes with aging. Balancing and muscle-strengthening exercises can reduce the risk of falling and fracture.  Consider therapeutic medications. Currently, several types of effective drugs are available. Healthcare providers can recommend the type most appropriate for each woman.  Eliminate environmental factors that may contribute to accidents. Falls cause nearly 90% of all osteoporotic fractures, so reducing this risk is an important bone-health strategy. Measures include ample lighting, removing obstructions to walking, using nonskid rugs on floors, and placing mats and/or grab bars in showers.  Be aware of medication side effects. Some common medicines make bones weaker. These include a type of steroid drug called glucocorticoids used for arthritis and asthma, some antiseizure drugs, certain sleeping pills, treatments for endometriosis, and some cancer drugs. An overactive thyroid gland or using too much thyroid hormone for an underactive thyroid can also be a problem. If you are taking these medicines, talk to your doctor about what you can do to help protect your bones.reduce the rate of osteoporosis.    Menopause can be associated with physical symptoms and risks. Hormone replacement therapy is available to decrease symptoms and risks. You should talk to your  health care provider   about whether hormone replacement therapy is right for you.   Use sunscreen. Apply sunscreen liberally and repeatedly throughout the day. You should seek shade when your shadow is shorter than you. Protect yourself by wearing long sleeves, pants, a wide-brimmed hat, and sunglasses year round, whenever you are outdoors.   Once a month, do a whole body skin exam, using a mirror to look at the skin on your back. Tell your health care provider of new moles, moles that have irregular borders, moles that are larger than a pencil eraser, or moles that have changed in shape or color.   -Stay current with required vaccines (immunizations).   Influenza vaccine. All adults should be immunized every year.  Tetanus, diphtheria, and acellular pertussis (Td, Tdap) vaccine. Pregnant women should receive 1 dose of Tdap vaccine during each pregnancy. The dose should be obtained regardless of the length of time since the last dose. Immunization is preferred during the 27th 36th week of gestation. An adult who has not previously received Tdap or who does not know her vaccine status should receive 1 dose of Tdap. This initial dose should be followed by tetanus and diphtheria toxoids (Td) booster doses every 10 years. Adults with an unknown or incomplete history of completing a 3-dose immunization series with Td-containing vaccines should begin or complete a primary immunization series including a Tdap dose. Adults should receive a Td booster every 10 years.  Varicella vaccine. An adult without evidence of immunity to varicella should receive 2 doses or a second dose if she has previously received 1 dose. Pregnant females who do not have evidence of immunity should receive the first dose after pregnancy. This first dose should be obtained before leaving the health care facility. The second dose should be obtained 4 8 weeks after the first dose.  Human papillomavirus (HPV) vaccine. Females aged 13 26  years who have not received the vaccine previously should obtain the 3-dose series. The vaccine is not recommended for use in pregnant females. However, pregnancy testing is not needed before receiving a dose. If a female is found to be pregnant after receiving a dose, no treatment is needed. In that case, the remaining doses should be delayed until after the pregnancy. Immunization is recommended for any person with an immunocompromised condition through the age of 26 years if she did not get any or all doses earlier. During the 3-dose series, the second dose should be obtained 4 8 weeks after the first dose. The third dose should be obtained 24 weeks after the first dose and 16 weeks after the second dose.  Zoster vaccine. One dose is recommended for adults aged 60 years or older unless certain conditions are present.  Measles, mumps, and rubella (MMR) vaccine. Adults born before 1957 generally are considered immune to measles and mumps. Adults born in 1957 or later should have 1 or more doses of MMR vaccine unless there is a contraindication to the vaccine or there is laboratory evidence of immunity to each of the three diseases. A routine second dose of MMR vaccine should be obtained at least 28 days after the first dose for students attending postsecondary schools, health care workers, or international travelers. People who received inactivated measles vaccine or an unknown type of measles vaccine during 1963 1967 should receive 2 doses of MMR vaccine. People who received inactivated mumps vaccine or an unknown type of mumps vaccine before 1979 and are at high risk for mumps infection should consider immunization with 2 doses of   MMR vaccine. For females of childbearing age, rubella immunity should be determined. If there is no evidence of immunity, females who are not pregnant should be vaccinated. If there is no evidence of immunity, females who are pregnant should delay immunization until after pregnancy.  Unvaccinated health care workers born before [birth year] who lack laboratory evidence of measles, mumps, or rubella immunity or laboratory confirmation of disease should consider measles and mumps immunization with 2 doses of MMR vaccine or rubella immunization with 1 dose of MMR vaccine.  Pneumococcal 13-valent conjugate (PCV13) vaccine. When indicated, a person who is uncertain of her immunization history and has no record of immunization should receive the PCV13 vaccine. An adult aged 54 years or older who has certain medical conditions and has not been previously immunized should receive 1 dose of PCV13 vaccine. This PCV13 should be followed with a dose of pneumococcal polysaccharide (PPSV23) vaccine. The PPSV23 vaccine dose should be obtained at least 8 weeks after the dose of PCV13 vaccine. An adult aged 58 years or older who has certain medical conditions and previously received 1 or more doses of PPSV23 vaccine should receive 1 dose of PCV13. The PCV13 vaccine dose should be obtained 1 or more years after the last PPSV23 vaccine dose.  Pneumococcal polysaccharide (PPSV23) vaccine. When PCV13 is also indicated, PCV13 should be obtained first. All adults aged 58 years and older should be immunized. An adult younger than age 65 years who has certain medical conditions should be immunized. Any person who resides in a nursing home or long-term care facility should be immunized. An adult smoker should be immunized. People with an immunocompromised condition and certain other conditions should receive both PCV13 and PPSV23 vaccines. People with human immunodeficiency virus (HIV) infection should be immunized as soon as possible after diagnosis. Immunization during chemotherapy or radiation therapy should be avoided. Routine use of PPSV23 vaccine is not recommended for American Indians, [place] Natives, or people younger than 65 years unless there are medical conditions that require PPSV23 vaccine. When indicated,  people who have unknown immunization and have no record of immunization should receive PPSV23 vaccine. One-time revaccination 5 years after the first dose of PPSV23 is recommended for people aged 70 64 years who have chronic kidney failure, nephrotic syndrome, asplenia, or immunocompromised conditions. People who received 1 2 doses of PPSV23 before age 32 years should receive another dose of PPSV23 vaccine at age 96 years or later if at least 5 years have passed since the previous dose. Doses of PPSV23 are not needed for people immunized with PPSV23 at or after age 55 years.  Meningococcal vaccine. Adults with asplenia or persistent complement component deficiencies should receive 2 doses of quadrivalent meningococcal conjugate (MenACWY-D) vaccine. The doses should be obtained at least 2 months apart. Microbiologists working with certain meningococcal bacteria, [employer] recruits, people at risk during an outbreak, and people who travel to or live in countries with a high rate of meningitis should be immunized. A first-year college student up through age 58 years who is living in a residence hall should receive a dose if she did not receive a dose on or after her 16th birthday. Adults who have certain high-risk conditions should receive one or more doses of vaccine.  Hepatitis A vaccine. Adults who wish to be protected from this disease, have certain high-risk conditions, work with hepatitis A-infected animals, work in hepatitis A research labs, or travel to or work in countries with a high rate of hepatitis A should be  immunized. Adults who were previously unvaccinated and who anticipate close contact with an international adoptee during the first 60 days after arrival in the [place] from a country with a high rate of hepatitis A should be immunized.  Hepatitis B vaccine.  Adults who wish to be protected from this disease, have certain high-risk conditions, may be exposed to blood or other infectious  body fluids, are household contacts or sex partners of hepatitis B positive people, are clients or workers in certain care facilities, or travel to or work in countries with a high rate of hepatitis B should be immunized.  Haemophilus influenzae type b (Hib) vaccine. A previously unvaccinated person with asplenia or sickle cell disease or having a scheduled splenectomy should receive 1 dose of Hib vaccine. Regardless of previous immunization, a recipient of a hematopoietic stem cell transplant should receive a 3-dose series 6 12 months after her successful transplant. Hib vaccine is not recommended for adults with HIV infection.  Preventive Services / Frequency Ages 6 to 39years  Blood pressure check.** / Every 1 to 2 years.  Lipid and cholesterol check.** / Every 5 years beginning at age 39.  Clinical breast exam.** / Every 3 years for women in their 61s and 62s.  BRCA-related cancer risk assessment.** / For women who have family members with a BRCA-related cancer (breast, ovarian, tubal, or peritoneal cancers).  Pap test.** / Every 2 years from ages 47 through 85. Every 3 years starting at age 34 through age 12 or 74 with a history of 3 consecutive normal Pap tests.  HPV screening.** / Every 3 years from ages 46 through ages 43 to 54 with a history of 3 consecutive normal Pap tests.  Hepatitis C blood test.** / For any individual with known risks for hepatitis C.  Skin self-exam. / Monthly.  Influenza vaccine. / Every year.  Tetanus, diphtheria, and acellular pertussis (Tdap, Td) vaccine.** / Consult your health care provider. Pregnant women should receive 1 dose of Tdap vaccine during each pregnancy. 1 dose of Td every 10 years.  Varicella vaccine.** / Consult your health care provider. Pregnant females who do not have evidence of immunity should receive the first dose after pregnancy.  HPV vaccine. / 3 doses over 6 months, if 64 and younger. The vaccine is not recommended for use in  pregnant females. However, pregnancy testing is not needed before receiving a dose.  Measles, mumps, rubella (MMR) vaccine.** / You need at least 1 dose of MMR if you were born in 1957 or later. You may also need a 2nd dose. For females of childbearing age, rubella immunity should be determined. If there is no evidence of immunity, females who are not pregnant should be vaccinated. If there is no evidence of immunity, females who are pregnant should delay immunization until after pregnancy.  Pneumococcal 13-valent conjugate (PCV13) vaccine.** / Consult your health care provider.  Pneumococcal polysaccharide (PPSV23) vaccine.** / 1 to 2 doses if you smoke cigarettes or if you have certain conditions.  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 You may also need additional booster doses.  Hepatitis A vaccine.** / Consult your health care provider.  Hepatitis B vaccine.** / Consult your health care provider.  Haemophilus influenzae type b (Hib) vaccine.** / Consult your health care provider.  Ages 55 to 64years  Blood pressure check.** / Every 1 to 2 years.  Lipid and cholesterol check.** / Every 5 years beginning at age 20 years.  Lung cancer screening. / Every year if you are aged 55 80 years and have a 30-pack-year history of smoking and currently smoke or have quit within the past 15 years. Yearly screening is stopped once you have quit smoking for at least 15 years or develop a health problem that would prevent you from having lung cancer treatment.  Clinical breast exam.** / Every year after age 40 years.  BRCA-related cancer risk assessment.** / For women who have family members with a BRCA-related cancer (breast, ovarian, tubal, or peritoneal cancers).  Mammogram.** / Every year beginning at age 40  years and continuing for as long as you are in good health. Consult with your health care provider.  Pap test.** / Every 3 years starting at age 30 years through age 65 or 70 years with a history of 3 consecutive normal Pap tests.  HPV screening.** / Every 3 years from ages 30 years through ages 65 to 70 years with a history of 3 consecutive normal Pap tests.  Fecal occult blood test (FOBT) of stool. / Every year beginning at age 50 years and continuing until age 75 years. You may not need to do this test if you get a colonoscopy every 10 years.  Flexible sigmoidoscopy or colonoscopy.** / Every 5 years for a flexible sigmoidoscopy or every 10 years for a colonoscopy beginning at age 50 years and continuing until age 75 years.  Hepatitis C blood test.** / For all people born from 1945 through 1965 and any individual with known risks for hepatitis C.  Skin self-exam. / Monthly.  Influenza vaccine. / Every year.  Tetanus, diphtheria, and acellular pertussis (Tdap/Td) vaccine.** / Consult your health care provider. Pregnant women should receive 1 dose of Tdap vaccine during each pregnancy. 1 dose of Td every 10 years.  Varicella vaccine.** / Consult your health care provider. Pregnant females who do not have evidence of immunity should receive the first dose after pregnancy.  Zoster vaccine.** / 1 dose for adults aged 60 years or older.  Measles, mumps, rubella (MMR) vaccine.** / You need at least 1 dose of MMR if you were born in 1957 or later. You may also need a 2nd dose. For females of childbearing age, rubella immunity should be determined. If there is no evidence of immunity, females who are not pregnant should be vaccinated. If there is no evidence of immunity, females who are pregnant should delay immunization until after pregnancy.  Pneumococcal 13-valent conjugate (PCV13) vaccine.** / Consult your health care provider.  Pneumococcal polysaccharide (PPSV23) vaccine.** / 1 to 2 doses if  you smoke cigarettes or if you have certain conditions.  Meningococcal vaccine.** / Consult your health care provider.  Hepatitis A vaccine.** / Consult your health care provider.  Hepatitis B vaccine.** / Consult your health care provider.  Haemophilus influenzae type b (Hib) vaccine.** / Consult your health care provider.  Ages 65 years and over  Blood pressure check.** / Every 1 to 2 years.  Lipid and cholesterol check.** / Every 5 years beginning at age 20 years.  Lung cancer screening. / Every year if you are aged 55 80 years and have a 30-pack-year history of smoking and currently smoke or have quit within the past 15 years. Yearly screening is stopped once you have quit smoking for at least 15 years or develop a health problem that   would prevent you from having lung cancer treatment.  Clinical breast exam.** / Every year after age 103 years.  BRCA-related cancer risk assessment.** / For women who have family members with a BRCA-related cancer (breast, ovarian, tubal, or peritoneal cancers).  Mammogram.** / Every year beginning at age 36 years and continuing for as long as you are in good health. Consult with your health care provider.  Pap test.** / Every 3 years starting at age 5 years through age 85 or 10 years with 3 consecutive normal Pap tests. Testing can be stopped between 65 and 70 years with 3 consecutive normal Pap tests and no abnormal Pap or HPV tests in the past 10 years.  HPV screening.** / Every 3 years from ages 93 years through ages 70 or 45 years with a history of 3 consecutive normal Pap tests. Testing can be stopped between 65 and 70 years with 3 consecutive normal Pap tests and no abnormal Pap or HPV tests in the past 10 years.  Fecal occult blood test (FOBT) of stool. / Every year beginning at age 8 years and continuing until age 45 years. You may not need to do this test if you get a colonoscopy every 10 years.  Flexible sigmoidoscopy or colonoscopy.** /  Every 5 years for a flexible sigmoidoscopy or every 10 years for a colonoscopy beginning at age 69 years and continuing until age 68 years.  Hepatitis C blood test.** / For all people born from [birth year] through 1966 and any individual with known risks for hepatitis C.  Osteoporosis screening.** / A one-time screening for women ages 7 years and over and women at risk for fractures or osteoporosis.  Skin self-exam. / Monthly.  Influenza vaccine. / Every year.  Tetanus, diphtheria, and acellular pertussis (Tdap/Td) vaccine.** / 1 dose of Td every 10 years.  Varicella vaccine.** / Consult your health care provider.  Zoster vaccine.** / 1 dose for adults aged 5 years or older.  Pneumococcal 13-valent conjugate (PCV13) vaccine.** / Consult your health care provider.  Pneumococcal polysaccharide (PPSV23) vaccine.** / 1 dose for all adults aged 74 years and older.  Meningococcal vaccine.** / Consult your health care provider.  Hepatitis A vaccine.** / Consult your health care provider.  Hepatitis B vaccine.** / Consult your health care provider.  Haemophilus influenzae type b (Hib) vaccine.** / Consult your health care provider. ** Family history and personal history of risk and conditions may change your health care provider's recommendations. Document Released: 02/11/2002 Document Revised: 10/06/2013  [facility] Patient Information 2014 [place], [place].   EXERCISE AND DIET:  We recommended that you start or continue a regular exercise program for good health. Regular exercise means any activity that makes your heart beat faster and makes you sweat.  We recommend exercising at least 30 minutes per day at least 3 days a week, preferably 5.  We also recommend a diet low in fat and sugar / carbohydrates.  Inactivity, poor dietary choices and obesity can cause diabetes, heart attack, stroke, and kidney damage, among others.     ALCOHOL AND SMOKING:  Women should limit their alcohol intake to no  more than 7 drinks/beers/glasses of wine (combined, not each!) per week. Moderation of alcohol intake to this level decreases your risk of breast cancer and liver damage.  ( And of course, no recreational drugs are part of a healthy lifestyle.)  Also, you should not be smoking at all or even being exposed to second hand smoke. Most people know smoking can  cause cancer, and various heart and lung diseases, but did you know it also contributes to weakening of your bones?  Aging of your skin?  Yellowing of your teeth and nails?   CALCIUM AND VITAMIN D:  Adequate intake of calcium and Vitamin D are recommended.  The recommendations for exact amounts of these supplements seem to change often, but generally speaking 600 mg of calcium (either carbonate or citrate) and 800 units of Vitamin D per day seems prudent. Certain women may benefit from higher intake of Vitamin D.  If you are among these women, your doctor will have told you during your visit.     PAP SMEARS:  Pap smears, to check for cervical cancer or precancers,  have traditionally been done yearly, although recent scientific advances have shown that most women can have pap smears less often.  However, every woman still should have a physical exam from her gynecologist or primary care physician every year. It will include a breast check, inspection of the vulva and vagina to check for abnormal growths or skin changes, a visual exam of the cervix, and then an exam to evaluate the size and shape of the uterus and ovaries.  And after 48 years of age, a rectal exam is indicated to check for rectal cancers. We will also provide age appropriate advice regarding health maintenance, like when you should have certain vaccines, screening for sexually transmitted diseases, bone density testing, colonoscopy, mammograms, etc.    MAMMOGRAMS:  All women over 65 years old should have a yearly mammogram. Many facilities now offer a "3D" mammogram, which may cost  around $50 extra out of pocket. If possible,  we recommend you accept the option to have the 3D mammogram performed.  It both reduces the number of women who will be called back for extra views which then turn out to be normal, and it is better than the routine mammogram at detecting truly abnormal areas.     COLONOSCOPY:  Colonoscopy to screen for colon cancer is recommended for all women at age 26.  We know, you hate the idea of the prep.  We agree, BUT, having colon cancer and not knowing it is worse!!  Colon cancer so often starts as a polyp that can be seen and removed at colonscopy, which can quite literally save your life!  And if your first colonoscopy is normal and you have no family history of colon cancer, most women don't have to have it again for 10 years.  Once every ten years, you can do something that may end up saving your life, right?  We will be happy to help you get it scheduled when you are ready.  Be sure to check your insurance coverage so you understand how much it will cost.  It may be covered as a preventative service at no cost, but you should check your particular policy.    Mediterranean Diet A Mediterranean diet refers to food and lifestyle choices that are based on the traditions of countries located on the [employer]. This way of eating has been shown to help prevent certain conditions and improve outcomes for people who have chronic diseases, like kidney disease and heart disease. What are tips for following this plan? Lifestyle  Cook and eat meals together with your family, when possible.  Drink enough fluid to keep your urine clear or pale yellow.  Be physically active every day. This includes: ? Aerobic exercise like running or swimming. ? Leisure activities like  gardening, walking, or housework.  Get 7-8 hours of sleep each night.  If recommended by your health care provider, drink red wine in moderation. This means 1 glass a day for nonpregnant women  and 2 glasses a day for men. A glass of wine equals 5 oz (150 mL). Reading food labels  Check the serving size of packaged foods. For foods such as rice and pasta, the serving size refers to the amount of cooked product, not dry.  Check the total fat in packaged foods. Avoid foods that have saturated fat or trans fats.  Check the ingredients list for added sugars, such as corn syrup. Shopping  At the grocery store, buy most of your food from the areas near the walls of the store. This includes: ? Fresh fruits and vegetables (produce). ? Grains, beans, nuts, and seeds. Some of these may be available in unpackaged forms or large amounts (in bulk). ? Fresh seafood. ? Poultry and eggs. ? Low-fat dairy products.  Buy whole ingredients instead of prepackaged foods.  Buy fresh fruits and vegetables in-season from local farmers markets.  Buy frozen fruits and vegetables in resealable bags.  If you do not have access to quality fresh seafood, buy precooked frozen shrimp or canned fish, such as tuna, salmon, or sardines.  Buy small amounts of raw or cooked vegetables, salads, or olives from the deli or salad bar at your store.  Stock your pantry so you always have certain foods on hand, such as olive oil, canned tuna, canned tomatoes, rice, pasta, and beans. Cooking  Cook foods with extra-virgin olive oil instead of using butter or other vegetable oils.  Have meat as a side dish, and have vegetables or grains as your main dish. This means having meat in small portions or adding small amounts of meat to foods like pasta or stew.  Use beans or vegetables instead of meat in common dishes like chili or lasagna.  Experiment with different cooking methods. Try roasting or broiling vegetables instead of steaming or sauteing them.  Add frozen vegetables to soups, stews, pasta, or rice.  Add nuts or seeds for added healthy fat at each meal. You can add these to yogurt, salads, or vegetable  dishes.  Marinate fish or vegetables using olive oil, lemon juice, garlic, and fresh herbs. Meal planning  Plan to eat 1 vegetarian meal one day each week. Try to work up to 2 vegetarian meals, if possible.  Eat seafood 2 or more times a week.  Have healthy snacks readily available, such as: ? Vegetable sticks with hummus. ? [place] yogurt. ? Fruit and nut trail mix.  Eat balanced meals throughout the week. This includes: ? Fruit: 2-3 servings a day ? Vegetables: 4-5 servings a day ? Low-fat dairy: 2 servings a day ? Fish, poultry, or lean meat: 1 serving a day ? Beans and legumes: 2 or more servings a week ? Nuts and seeds: 1-2 servings a day ? Whole grains: 6-8 servings a day ? Extra-virgin olive oil: 3-4 servings a day  Limit red meat and sweets to only a few servings a month What are my food choices?  Mediterranean diet ? Recommended ? Grains: Whole-grain pasta. Brown rice. Bulgar wheat. Polenta. Couscous. Whole-wheat bread. [place]. ? Vegetables: Artichokes. Beets. Broccoli. Cabbage. Carrots. Eggplant. Green beans. Chard. Kale. Spinach. Onions. Leeks. Peas. Squash. Tomatoes. Peppers. Radishes. ? Fruits: Apples. Apricots. Avocado. Berries. Bananas. Cherries. Dates. Figs. Grapes. Lemons. Melon. Oranges. Peaches. Plums. Pomegranate. ? Meats and other protein  foods: Beans. Almonds. Sunflower seeds. Pine nuts. Peanuts. [place]. Salmon. Scallops. Shrimp. Fountain. Tilapia. Clams. Oysters. Eggs. ? Dairy: Low-fat milk. Cheese. Greek yogurt. ? Beverages: Water. Red wine. Herbal tea. ? Fats and oils: Extra virgin olive oil. Avocado oil. Grape seed oil. ? Sweets and desserts: [place] yogurt with honey. Baked apples. Poached pears. Trail mix. ? Seasoning and other foods: Basil. Cilantro. Coriander. Cumin. Mint. Parsley. Sage. Rosemary. Tarragon. Garlic. Oregano. Thyme. Pepper. Balsalmic vinegar. Tahini. Hummus. Tomato sauce. Olives. Mushrooms. ? Limit these ? Grains: Prepackaged pasta or  rice dishes. Prepackaged cereal with added sugar. ? Vegetables: Deep fried potatoes (french fries). ? Fruits: Fruit canned in syrup. ? Meats and other protein foods: Beef. Pork. Lamb. Poultry with skin. Hot dogs. Berniece Salines. ? Dairy: Ice cream. Sour cream. Whole milk. ? Beverages: Juice. Sugar-sweetened soft drinks. Beer. Liquor and spirits. ? Fats and oils: Butter. Canola oil. Vegetable oil. Beef fat (tallow). Lard. ? Sweets and desserts: Cookies. Cakes. Pies. Candy. ? Seasoning and other foods: Mayonnaise. Premade sauces and marinades. ? The items listed may not be a complete list. Talk with your dietitian about what dietary choices are right for you. Summary  The Mediterranean diet includes both food and lifestyle choices.  Eat a variety of fresh fruits and vegetables, beans, nuts, seeds, and whole grains.  Limit the amount of red meat and sweets that you eat.  Talk with your health care provider about whether it is safe for you to drink red wine in moderation. This means 1 glass a day for nonpregnant women and 2 glasses a day for men. A glass of wine equals 5 oz (150 mL). This information is not intended to replace advice given to you by your health care provider. Make sure you discuss any questions you have with your health care provider. Document Released: 08/08/2016 Document Revised: 09/10/2016 Document Reviewed: 08/08/2016 Elsevier Interactive Patient Education  2018 [employer].   Increase water intake, strive for at least 85 ounces/day.   Follow Heart Healthy diet Increase regular exercise.  Recommend at least 30 minutes daily, 5 days per week of walking, jogging, biking, swimming, YouTube/Pinterest workout videos. Your recent labs look good, recommend repeating CBC in 3 months to check for anemia. Referral to General Surgery placed, re: enlarging upper extremity lipomas. Reduce tobacco and alcohol use. Please schedule lab appt in 3 months, re: CBC repeat Please schedule  complete physical with fasting labs in 1 year. NICE TO SEE YOU!

## 2018-07-20 ENCOUNTER — Ambulatory Visit: Payer: Self-pay | Admitting: Surgery

## 2018-07-20 NOTE — H&P (Signed)
History of Present Illness Imogene Burn. Sevin Langenbach MD; 07/20/2018 11:42 AM) The patient is a 47 year old female who presents with a complaint of Mass. Referred by Mina Marble, NP for multiple bilateral upper extremity lipomas  This is a healthy 47 year old female who presents with over 20 year history of slowly enlarging masses over both upper extremities. She has several located on her forearms that are causing discomfort. 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 She would like to have these excised. She has not had any imaging.   Diagnostic Studies History Dalbert [place], [place]; 07/20/2018 9:59 AM) Colonoscopy never Mammogram 1-3 years ago Pap Smear 1-5 years ago  Allergies Dalbert [place], [place]; 07/20/2018 10:00 AM) No Known Drug Allergies [07/20/2018]:  Medication History Dalbert [place], CMA; 07/20/2018 10:00 AM) No Current Medications  Social History Dalbert [place], CMA; 07/20/2018 9:59 AM) Alcohol use Moderate alcohol use. Caffeine use Carbonated beverages, Tea. No drug use Tobacco use Current every day smoker.  Family History Dalbert [place], [place]; 07/20/2018 9:59 AM) Migraine Headache Sister. Respiratory Condition Mother.  Pregnancy / Birth History Dalbert [place], [place]; 07/20/2018 9:59 AM) Age at menarche 2 years. Contraceptive History Oral contraceptives. Gravida 3 Maternal age 46-25 Para 2 Regular periods  Other Problems Dalbert [place], CMA; 07/20/2018 9:59 AM) Gastroesophageal Reflux Disease     Review of Systems Dalbert [place] CMA; 07/20/2018 9:59 AM) General Not Present- Appetite Loss, Chills, Fatigue, Fever, Night Sweats, Weight Gain and Weight Loss. Skin Not Present- Change in Wart/Mole, Dryness, Hives, Jaundice, New Lesions, Non-Healing Wounds, Rash and Ulcer. HEENT Not Present- Earache, Hearing Loss, Hoarseness, Nose Bleed, Oral Ulcers, Ringing in the Ears, Seasonal  Allergies, Sinus Pain, Sore Throat, Visual Disturbances, Wears glasses/contact lenses and Yellow Eyes. Respiratory Not Present- Bloody sputum, Chronic Cough, Difficulty Breathing, Snoring and Wheezing. Breast Not Present- Breast Mass, Breast Pain, Nipple Discharge and Skin Changes. Cardiovascular Not Present- Chest Pain, Difficulty Breathing Lying Down, Leg Cramps, Palpitations, Rapid Heart Rate, Shortness of Breath and Swelling of Extremities. Gastrointestinal Not Present- Abdominal Pain, Bloating, Bloody Stool, Change in Bowel Habits, Chronic diarrhea, Constipation, Difficulty Swallowing, Excessive gas, Gets full quickly at meals, Hemorrhoids, Indigestion, Nausea, Rectal Pain and Vomiting. Female Genitourinary Not Present- Frequency, Nocturia, Painful Urination, Pelvic Pain and Urgency. Musculoskeletal Not Present- Back Pain, Joint Pain, Joint Stiffness, Muscle Pain, Muscle Weakness and Swelling of Extremities. Neurological Present- Tremor. Not Present- Decreased Memory, Fainting, Headaches, Numbness, Seizures, Tingling, Trouble walking and Weakness. Psychiatric Not Present- Anxiety, Bipolar, Change in Sleep Pattern, Depression, Fearful and Frequent crying. Hematology Not Present- Blood Thinners, Easy Bruising, Excessive bleeding, Gland problems, HIV and Persistent Infections.  Vitals Dalbert [place] CMA; 07/20/2018 10:01 AM) 07/20/2018 10:00 AM Weight: 174 lb Height: 67in Body Surface Area: 1.91 m Body Mass Index: 27.25 kg/m  Temp.: 98.22F  Pulse: 77 (Regular)  BP: 122/80 (Sitting, Left Arm, Standard)      Physical Exam Rodman Key K. Malachy Coleman MD; 07/20/2018 11:45 AM)  The physical exam findings are as follows: Note:WDWN in NAD Eyes: Pupils equal, round; sclera anicteric HENT: Oral mucosa moist; good dentition Neck: No masses palpated, no thyromegaly Lungs: CTA bilaterally; normal respiratory effort CV: Regular rate and rhythm; no murmurs; extremities well-perfused with no  edema Abd: +bowel sounds, soft, non-tender, no palpable organomegaly; no palpable hernias Right upper extremity - 3 cm subcutaneous mass, mobile, smooth over right biceps; Right forearm - she has a cluster of three subcutaneous masses on the  volar aspect, ranging in size from 1.5 to 3.0 cm; distal to this, there is another 1.5 cm mass. On the dorsal aspect, there are two other masses, one 2.5 cm and the other 1.5 cm. Left forearm - two separate subcutaneous masses - 1.5 cm and 1.5 cm Skin: Warm, dry; no sign of jaundice Psychiatric - alert and oriented x 4; calm mood and affect    Assessment & Plan Rodman Key K. Olukemi Panchal MD; 07/20/2018 10:19 AM)  LIPOMA OF LEFT FOREARM (D17.22) Impression: Two separate masses - 1.5 cm and 1.5 cm   LIPOMA OF RIGHT FOREARM (D17.21) Impression: six total - ranging from 3.0 cm to 1.5 cm   LIPOMA OF UPPER ARM (D17.79) Impression: Right upper anterior 3 cm  Current Plans Schedule for Surgery - excision of multiple subcutaneous lipomas bilateral upper extremities. The surgical procedure has been discussed with the patient. Potential risks, benefits, alternative treatments, and expected outcomes have been explained. All of the patient's questions at this time have been answered. The likelihood of reaching the patient's treatment goal is good. The patient understand the proposed surgical procedure and wishes to proceed.  Imogene Burn. Georgette Dover, MD, [facility]  General/ Trauma Surgery  07/20/2018 11:45 AM

## 2018-07-28 NOTE — Progress Notes (Signed)
Opened in error. T. , CMA 

## 2018-08-26 LAB — HM PAP SMEAR: HM Pap smear: NEGATIVE

## 2018-08-26 LAB — HM MAMMOGRAPHY

## 2018-09-09 ENCOUNTER — Ambulatory Visit (INDEPENDENT_AMBULATORY_CARE_PROVIDER_SITE_OTHER): Payer: Managed Care, Other (non HMO) | Admitting: Adult Health

## 2018-09-09 ENCOUNTER — Encounter: Payer: Self-pay | Admitting: Adult Health

## 2018-09-09 VITALS — BP 130/86 | HR 89 | Temp 98.4°F | Ht 67.5 in | Wt 170.5 lb

## 2018-09-09 DIAGNOSIS — R109 Unspecified abdominal pain: Secondary | ICD-10-CM | POA: Diagnosis not present

## 2018-09-09 DIAGNOSIS — M62830 Muscle spasm of back: Secondary | ICD-10-CM | POA: Insufficient documentation

## 2018-09-09 LAB — POCT URINALYSIS DIPSTICK
APPEARANCE: NEGATIVE
Glucose, UA: NEGATIVE
LEUKOCYTES UA: NEGATIVE
NITRITE UA: NEGATIVE
PH UA: 5.5 (ref 5.0–8.0)
PROTEIN UA: NEGATIVE
Spec Grav, UA: >=1.03 — AB (ref 1.010–1.025)
UROBILINOGEN UA: 4 U/dL — AB

## 2018-09-09 NOTE — Assessment & Plan Note (Signed)
Muscle spasm care instructions provided.  Please use cyclobenzaprine 10mg  at night the next few evenings. Follow-up as needed.

## 2018-09-09 NOTE — Assessment & Plan Note (Addendum)
UA-neg for UTI Specific Gravity >1.030- instructed to drink more water She denies urinary sx's or hx of nephrolithiasis

## 2018-09-09 NOTE — Progress Notes (Signed)
Subjective:    Patient ID: Allison Wallace, female    DOB: [birth year], 47 y.o.   MRN: [id number]  HPI:  Ms. Chenard presents with dark urine and intermittent mid back back pain (dull ache, rated 5/10) that have been present for >1 week. She estimates to only drink 8-10 oz water/day. She denies urinary frequency/dysuria/malodorous urine. She reports chronic "back arthritis" that will intermittently flare up, she denies heavy lifting/strenuous exercise prior to onset of sx's. She denies fever/night sweats/N/V/D  Patient Care Team    Relationship Specialty Notifications Start End  Mina Marble D, NP PCP - General Family Medicine  05/13/17   Allyn Kenner, MD  Dermatology  05/13/17   Olga Millers, MD Consulting Physician Obstetrics and Gynecology  05/13/17     Patient Active Problem List   Diagnosis Date Noted  . Flank pain 09/09/2018  . Spasm of thoracic back muscle 09/09/2018  . Elevated LDL cholesterol level 07/06/2018  . Abnormal CBC 07/06/2018  . 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 Tremor of unknown origin 07/06/2018  . Family history of high cholesterol 05/13/2017  . Fatigue 05/13/2017  . Healthcare maintenance 05/13/2017  . Tobacco use disorder 05/13/2017     History reviewed. No pertinent past medical history.   History reviewed. No pertinent surgical history.   Family History  Problem Relation Age of Onset  . Hyperlipidemia Father      Social History   Substance and Sexual Activity  Drug Use No     Social History   Substance and Sexual Activity  Alcohol Use Yes  . Alcohol/week: 4.0 standard drinks  . Types: 4 Glasses of wine per week     Social History   Tobacco Use  Smoking Status Current Every Day Smoker  . Types: Cigarettes  Smokeless Tobacco Current User  Tobacco Comment   1 pack every 2 days     Outpatient Encounter Medications as of 09/09/2018  Medication Sig  . Ivermectin (SOOLANTRA) 1 % CREA Apply topically.  . Minocycline HCl  (SOLODYN) 115 MG TB24 Solodyn 115 mg tablet,extended release  TAKE 1 TABLET BY MOUTH EVERY DAY WITH FOOD AND WATER, (SUN WARNING)  . Multiple Vitamin (ONE-A-DAY ADULT VITACRAVES+DHA) CHEW Chew by mouth.   No facility-administered encounter medications on file as of 09/09/2018.     Allergies: Patient has no known allergies.  Body mass index is 26.31 kg/m.  Blood pressure 130/86, pulse 89, temperature 98.4 F (36.9 C), temperature source Oral, height 5' 7.5" (1.715 m), weight 170 lb 8 oz (77.3 kg), last menstrual period 09/01/2018, SpO2 100 %.  Review of Systems  Constitutional: Positive for fatigue. Negative for activity change, appetite change, chills, diaphoresis, fever and unexpected weight change.  Eyes: Negative for visual disturbance.  Respiratory: Negative for cough, chest tightness, shortness of breath, wheezing and stridor.   Cardiovascular: Negative for chest pain, palpitations and leg swelling.  Gastrointestinal: Negative for abdominal distention, abdominal pain, blood in stool, constipation, diarrhea, nausea and vomiting.  Endocrine: Negative for cold intolerance, heat intolerance, polydipsia, polyphagia and polyuria.  Genitourinary: Positive for flank pain. Negative for difficulty urinating, dysuria, frequency, hematuria, pelvic pain and urgency.  Neurological: Negative for dizziness and headaches.  Hematological: Does not bruise/bleed easily.       Objective:   Physical Exam  Constitutional: She is oriented to person, place, and time. She appears well-developed and well-nourished. No distress.  HENT:  Head: Normocephalic.  Right Ear: External ear normal.  Left Ear: External ear normal.  Nose:  Nose normal.  Mouth/Throat: Oropharynx is clear and moist.  Eyes: Pupils are equal, round, and reactive to light. Conjunctivae and EOM are normal.  Cardiovascular: Normal rate, regular rhythm, normal heart sounds and intact distal pulses.  No murmur heard. Pulmonary/Chest:  Effort normal and breath sounds normal. No stridor. No respiratory distress. She has no wheezes. She has no rales. She exhibits no tenderness.  Abdominal: There is no tenderness. There is no CVA tenderness.  Musculoskeletal: She exhibits tenderness.       Cervical back: Normal.       Thoracic back: She exhibits tenderness and spasm.  Neurological: She is alert and oriented to person, place, and time.  Skin: Skin is warm and dry. Capillary refill takes less than 2 seconds. No rash noted. She is not diaphoretic. No erythema. No pallor.  Psychiatric: She has a normal mood and affect. Her behavior is normal. Judgment and thought content normal.  Nursing note and vitals reviewed.     Assessment & Plan:   1. Flank pain   2. Spasm of thoracic back muscle     Spasm of thoracic back muscle Muscle spasm care instructions provided.  Please use cyclobenzaprine 10mg  at night the next few evenings. Follow-up as needed.  Flank pain UA-neg for UTI Specific Gravity >1.030- instructed to drink more water She denies urinary sx's or hx of nephrolithiasis     FOLLOW-UP:  Return if symptoms worsen or fail to improve.

## 2018-09-09 NOTE — Patient Instructions (Signed)
Muscle Cramps and Spasms Muscle cramps and spasms are when muscles tighten by themselves. They usually get better within minutes. Muscle cramps are painful. They are usually stronger and last longer than muscle spasms. Muscle spasms may or may not be painful. They can last a few seconds or much longer. Follow these instructions at home:  Drink enough fluid to keep your pee (urine) clear or pale yellow.  Massage, stretch, and relax the muscle.  If directed, apply heat to tight or tense muscles as often as told by your doctor. Use the heat source that your doctor recommends. ? Place a towel between your skin and the heat source. ? Leave the heat on for 20-30 minutes. ? Take off the heat if your skin turns bright red. This is especially important if you are unable to feel pain, heat, or cold. You may have a greater risk of getting burned.  If directed, put ice on the affected area. This may help if you are sore or have pain after a cramp or spasm. ? Put ice in a plastic bag. ? Place a towel between your skin and the bag. ? Leave the ice on for 20 minutes, 2-3 times a day.  Take over-the-counter and prescription medicines only as told by your doctor.  Pay attention to any changes in your symptoms. Contact a doctor if:  Your cramps or spasms get worse or happen more often.  Your cramps or spasms do not get better with time. This information is not intended to replace advice given to you by your health care provider. Make sure you discuss any questions you have with your health care provider. Document Released: 11/28/2008 Document Revised: 01/17/2016 Document Reviewed: 09/19/2015 Elsevier Interactive Patient Education  2018 [employer].  Urinalysis normal. Increase water intake, strive for at least 85 ounces/day.   Please use cyclobenzaprine 10mg  at night the next few evenings. Follow-up as needed. FEEL BETTER!

## 2018-10-06 ENCOUNTER — Other Ambulatory Visit: Payer: Self-pay

## 2018-10-06 ENCOUNTER — Other Ambulatory Visit (INDEPENDENT_AMBULATORY_CARE_PROVIDER_SITE_OTHER): Payer: Managed Care, Other (non HMO)

## 2018-10-06 DIAGNOSIS — R7989 Other specified abnormal findings of blood chemistry: Secondary | ICD-10-CM

## 2018-10-07 LAB — CBC WITH DIFFERENTIAL/PLATELET
BASOS ABS: 0 10*3/uL (ref 0.0–0.2)
Basos: 1 %
EOS (ABSOLUTE): 0.1 10*3/uL (ref 0.0–0.4)
Eos: 2 %
HEMOGLOBIN: 15.5 g/dL (ref 11.1–15.9)
Hematocrit: 45.7 % (ref 34.0–46.6)
IMMATURE GRANS (ABS): 0 10*3/uL (ref 0.0–0.1)
IMMATURE GRANULOCYTES: 1 %
LYMPHS: 27 %
Lymphocytes Absolute: 1.8 10*3/uL (ref 0.7–3.1)
MCH: 36.2 pg — ABNORMAL HIGH (ref 26.6–33.0)
MCHC: 33.9 g/dL (ref 31.5–35.7)
MCV: 107 fL — ABNORMAL HIGH (ref 79–97)
MONOCYTES: 7 %
Monocytes Absolute: 0.4 10*3/uL (ref 0.1–0.9)
NEUTROS ABS: 4.2 10*3/uL (ref 1.4–7.0)
Neutrophils: 62 %
Platelets: 315 10*3/uL (ref 150–450)
RBC: 4.28 x10E6/uL (ref 3.77–5.28)
RDW: 12.5 % (ref 12.3–15.4)
WBC: 6.5 10*3/uL (ref 3.4–10.8)

## 2018-12-14 ENCOUNTER — Ambulatory Visit: Payer: Managed Care, Other (non HMO) | Admitting: Adult Health

## 2018-12-14 ENCOUNTER — Encounter: Payer: Self-pay | Admitting: Adult Health

## 2018-12-14 VITALS — BP 131/84 | HR 88 | Temp 98.5°F | Ht 67.5 in | Wt 179.0 lb

## 2018-12-14 DIAGNOSIS — M62830 Muscle spasm of back: Secondary | ICD-10-CM

## 2018-12-14 DIAGNOSIS — R350 Frequency of micturition: Secondary | ICD-10-CM

## 2018-12-14 DIAGNOSIS — Z0000 Encounter for general adult medical examination without abnormal findings: Secondary | ICD-10-CM | POA: Diagnosis not present

## 2018-12-14 DIAGNOSIS — R5383 Other fatigue: Secondary | ICD-10-CM

## 2018-12-14 LAB — POCT URINALYSIS DIPSTICK
BILIRUBIN UA: NEGATIVE
Glucose, UA: NEGATIVE
KETONES UA: NEGATIVE
Leukocytes, UA: NEGATIVE
Nitrite, UA: NEGATIVE
Protein, UA: NEGATIVE
Spec Grav, UA: 1.025 (ref 1.010–1.025)
Urobilinogen, UA: 0.2 E.U./dL
pH, UA: 6 (ref 5.0–8.0)

## 2018-12-14 MED ORDER — CYCLOBENZAPRINE HCL 10 MG PO TABS: 10.0000 mg | ORAL_TABLET | Freq: Three times a day (TID) | ORAL | 0 refills | Status: DC | PRN

## 2018-12-14 MED ORDER — CHLORZOXAZONE 250 MG PO TABS: 250.0000 mg | ORAL_TABLET | Freq: Four times a day (QID) | ORAL | 0 refills | Status: AC | PRN

## 2018-12-14 NOTE — Assessment & Plan Note (Signed)
Increase daily stretching Chlorzoxazone 250mg  Q6H PRN

## 2018-12-14 NOTE — Progress Notes (Signed)
Subjective:    Patient ID: Allison Wallace, female    DOB: Sep 10, 1971, 47 y.o.   MRN: [id number]  HPI:  Allison Wallace presents with urinary frequency and bladder "pressure" that developed 48 hrs ago. Allison Wallace denies abdominal/flank pain Allison Wallace denies N/V/D/dysuria Allison Wallace denies hx of kidney stones Allison Wallace reports increase in fatigue and lack of motivation to exercise Allison Wallace estimates to drink 20-30 oz plain water/day, prefers to hydrate with Pepsi Allison Wallace eats a diet high in sodium Allison Wallace drink a bottle of wine most nights of the week- denies ETOH use causing problems with work or interpersonal relationships. Allison Wallace reports smoking 7-10 cigarettes when drinking wine Discussed that poor diet, lack of exercise, heavy ETOH use, tobacco use will disrupt sleep and cause fatigue and can negatively impact overall health/well-being Allison Wallace continues to reports intermittent low back spasm/pain Will obtain CMP and CBC today Has had elevated MCH and MCH levels in past  Patient Care Team    Relationship Specialty Notifications Start End  Mina Marble D, NP PCP - General Family Medicine  05/13/17   Allyn Kenner, MD  Dermatology  05/13/17   Olga Millers, MD Consulting Physician Obstetrics and Gynecology  05/13/17     Patient Active Problem List   Diagnosis Date Noted  . Urinary frequency 12/14/2018  . Flank pain 09/09/2018  . Spasm of thoracic back muscle 09/09/2018  . Elevated LDL cholesterol level 07/06/2018  . Abnormal CBC 07/06/2018  . 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 Tremor of unknown origin 07/06/2018  . Family history of high cholesterol 05/13/2017  . Fatigue 05/13/2017  . Healthcare maintenance 05/13/2017  . Tobacco use disorder 05/13/2017     History reviewed. No pertinent past medical history.   History reviewed. No pertinent surgical history.   Family History  Problem Relation Age of Onset  . Hyperlipidemia Father      Social History   Substance and Sexual Activity  Drug Use No     Social  History   Substance and Sexual Activity  Alcohol Use Yes  . Alcohol/week: 4.0 standard drinks  . Types: 4 Glasses of wine per week     Social History   Tobacco Use  Smoking Status Current Every Day Smoker  . Types: Cigarettes  Smokeless Tobacco Current User  Tobacco Comment   1 pack every 2 days     Outpatient Encounter Medications as of 12/14/2018  Medication Sig  . Ivermectin (SOOLANTRA) 1 % CREA Apply topically.  . Multiple Vitamin (ONE-A-DAY ADULT VITACRAVES+DHA) CHEW Chew by mouth.  . chlorzoxazone (PARAFON FORTE) 250 MG tablet Take 1 tablet (250 mg total) by mouth 4 (four) times daily as needed for up to 10 days for muscle spasms.  . [DISCONTINUED] cyclobenzaprine (FLEXERIL) 10 MG tablet Take 1 tablet (10 mg total) by mouth 3 (three) times daily as needed for muscle spasms.  . [DISCONTINUED] Minocycline HCl (SOLODYN) 115 MG TB24 Solodyn 115 mg tablet,extended release  TAKE 1 TABLET BY MOUTH EVERY DAY WITH FOOD AND WATER, (SUN WARNING)   No facility-administered encounter medications on file as of 12/14/2018.     Allergies: Patient has no known allergies.  Body mass index is 27.62 kg/m.  Blood pressure 131/84, pulse 88, temperature 98.5 F (36.9 C), temperature source Oral, height 5' 7.5" (1.715 m), weight 179 lb (81.2 kg), last menstrual period 11/23/2018, SpO2 100 %.  Review of Systems  Constitutional: Positive for activity change and fatigue. Negative for appetite change, chills, diaphoresis, fever and unexpected weight change.  Eyes: Negative for visual disturbance.  Respiratory: Negative for cough, chest tightness, shortness of breath, wheezing and stridor.   Cardiovascular: Negative for chest pain, palpitations and leg swelling.  Gastrointestinal: Negative for abdominal distention, abdominal pain, blood in stool, constipation, diarrhea, nausea and vomiting.  Endocrine: Negative for cold intolerance, heat intolerance, polydipsia, polyphagia and polyuria.   Genitourinary: Positive for frequency. Negative for dysuria and flank pain.  Musculoskeletal: Positive for arthralgias, back pain and myalgias.  Skin: Negative for color change, pallor, rash and wound.  Neurological: Negative for dizziness and headaches.  Psychiatric/Behavioral: Positive for sleep disturbance. Negative for agitation, behavioral problems, confusion, decreased concentration, dysphoric mood, hallucinations, self-injury and suicidal ideas. The patient is nervous/anxious. The patient is not hyperactive.        Objective:   Physical Exam Constitutional:      Appearance: Normal appearance.  HENT:     Head: Normocephalic and atraumatic.  Neck:     Musculoskeletal: Normal range of motion and neck supple.  Cardiovascular:     Rate and Rhythm: Normal rate and regular rhythm.     Pulses: Normal pulses.     Heart sounds: Normal heart sounds. No murmur.  Pulmonary:     Effort: Pulmonary effort is normal. No respiratory distress.     Breath sounds: Normal breath sounds. No stridor. No wheezing, rhonchi or rales.  Chest:     Chest wall: No tenderness.  Abdominal:     General: Abdomen is flat. Bowel sounds are normal. There is no distension.     Palpations: Abdomen is soft.     Tenderness: There is no right CVA tenderness.  Skin:    Capillary Refill: Capillary refill takes less than 2 seconds.  Neurological:     Mental Status: Allison Wallace is alert and oriented to person, place, and time.     Motor: Tremor present.     Comments: Fine generalized tremor noted  Psychiatric:        Attention and Perception: Attention normal.        Mood and Affect: Mood is anxious.        Speech: Speech normal.        Behavior: Behavior normal.        Thought Content: Thought content normal.        Cognition and Memory: Cognition and memory normal.        Judgment: Judgment normal.       Assessment & Plan:   1. Urinary frequency   2. Fatigue, unspecified type   3. Healthcare maintenance   4.  Spasm of thoracic back muscle     Healthcare maintenance Increase plain water intake, strive for at least 64 oz/day. Follow DADH Diet. Please use Parafon as needed for back spasms. Recommend at least 30 minutes daily, 5 days per week of walking, jogging, biking, swimming, YouTube/Pinterest workout videos. Today your urinalysis is normal today. If "bladder pressure" and urinary frequency persist for another 2 weeks, please call and we will send you for imaging.  Try to reduce nightly wine consumption to no more than two glasses/night. Try to reduce-stop all tobacco use- YOU CAN DO IT! We will you when lab results are available.  Urinary frequency UA: Blood: Trace Nit:Neg Leu: Neg  Spasm of thoracic back muscle Increase daily stretching Chlorzoxazone 250mg  Q6H PRN  Pt was in the office today for 25+ minutes, I spent >75% of time in face to face counseling of patient's various medical conditions and in coordination of care  FOLLOW-UP:  Return in about 3 months (around 03/15/2019) for Regular Follow Up.

## 2018-12-14 NOTE — Assessment & Plan Note (Signed)
UA: Blood: Trace Nit:Neg Leu: Neg

## 2018-12-14 NOTE — Patient Instructions (Addendum)
Urinary Frequency, Adult Urinary frequency means urinating more often than usual. People with urinary frequency urinate at least 8 times in 24 hours, even if they drink a normal amount of fluid. Although they urinate more often than normal, the total amount of urine produced in a day may be normal. Urinary frequency is also called pollakiuria. What are the causes? This condition may be caused by:  A urinary tract infection.  Obesity.  Bladder problems, such as bladder stones.  Caffeine or alcohol.  Eating food or drinking fluids that irritate the bladder. These include coffee, tea, soda, artificial sweeteners, citrus, tomato-based foods, and chocolate.  Certain medicines, such as medicines that help the body get rid of extra fluid (diuretics).  Muscle or nerve weakness.  Overactive bladder.  Chronic diabetes.  Interstitial cystitis.  In men, problems with the prostate, such as an enlarged prostate.  In women, pregnancy.  In some cases, the cause may not be known. What increases the risk? This condition is more likely to develop in:  Women who have gone through menopause.  Men with prostate problems.  People with a disease or injury that affects the nerves or spinal cord.  People who have or have had a condition that affects the brain, such as a stroke.  What are the signs or symptoms? Symptoms of this condition include:  Feeling an urgent need to urinate often. The stress and anxiety of needing to find a bathroom quickly can make this urge worse.  Urinating 8 or more times in 24 hours.  Urinating as often as every 1 to 2 hours.  How is this diagnosed? This condition is diagnosed based on your symptoms, your medical history, and a physical exam. You may have tests, such as:  Blood tests.  Urine tests.  Imaging tests, such as X-rays or ultrasounds.  A bladder test.  A test of your neurological system. This is the body system that senses the need to  urinate.  A test to check for problems in the urethra and bladder called cystoscopy.  You may also be asked to keep a bladder diary. A bladder diary is a record of what you eat and drink, how often you urinate, and how much you urinate. You may need to see a health care provider who specializes in conditions of the urinary tract (urologist) or kidneys (nephrologist). How is this treated? Treatment for this condition depends on the cause. Sometimes the condition goes away on its own and treatment is not necessary. If treatment is needed, it may include:  Taking medicine.  Learning exercises that strengthen the muscles that help control urination.  Following a bladder training program. This may include: ? Learning to delay going to the bathroom. ? Double urinating (voiding). This helps if you are not completely emptying your bladder. ? Scheduled voiding.  Making diet changes, such as: ? Avoiding caffeine. ? Drinking fewer fluids, especially alcohol. ? Not drinking in the evening. ? Not having foods or drinks that may irritate the bladder. ? Eating foods that help prevent or ease constipation. Constipation can make this condition worse.  Having the nerves in your bladder stimulated. There are two options for stimulating the nerves to your bladder: ? Outpatient electrical nerve stimulation. This is done by your health care provider. ? Surgery to implant a bladder pacemaker. The pacemaker helps to control the urge to urinate.  Follow these instructions at home:  Keep a bladder diary if told to by your health care provider.  Take over-the-counter   and prescription medicines only as told by your health care provider.  Do any exercises as told by your health care provider.  Follow a bladder training program as told by your health care provider.  Make any recommended diet changes.  Keep all follow-up visits as told by your health care provider. This is important. Contact a health care  provider if:  You start urinating more often.  You feel pain or irritation when you urinate.  You notice blood in your urine.  Your urine looks cloudy.  You develop a fever.  You begin vomiting. Get help right away if:  You are unable to urinate. This information is not intended to replace advice given to you by your health care provider. Make sure you discuss any questions you have with your health care provider. Document Released: 10/12/2009 Document Revised: 01/17/2016 Document Reviewed: 07/12/2015 Elsevier Interactive Patient Education  2018 [place] Eating Plan DASH stands for "Dietary Approaches to Stop Hypertension." The DASH eating plan is a healthy eating plan that has been shown to reduce high blood pressure (hypertension). It may also reduce your risk for type 2 diabetes, heart disease, and stroke. The DASH eating plan may also help with weight loss. What are tips for following this plan? General guidelines  Avoid eating more than 2,300 mg (milligrams) of salt (sodium) a day. If you have hypertension, you may need to reduce your sodium intake to 1,500 mg a day.  Limit alcohol intake to no more than 1 drink a day for nonpregnant women and 2 drinks a day for men. One drink equals 12 oz of beer, 5 oz of wine, or 1 oz of hard liquor.  Work with your health care provider to maintain a healthy body weight or to lose weight. Ask what an ideal weight is for you.  Get at least 30 minutes of exercise that causes your heart to beat faster (aerobic exercise) most days of the week. Activities may include walking, swimming, or biking.  Work with your health care provider or diet and nutrition specialist (dietitian) to adjust your eating plan to your individual calorie needs. Reading food labels  Check food labels for the amount of sodium per serving. Choose foods with less than 5 percent of the Daily Value of sodium. Generally, foods with less than 300 mg of sodium per  serving fit into this eating plan.  To find whole grains, look for the word "whole" as the first word in the ingredient list. Shopping  Buy products labeled as "low-sodium" or "no salt added."  Buy fresh foods. Avoid canned foods and premade or frozen meals. Cooking  Avoid adding salt when cooking. Use salt-free seasonings or herbs instead of table salt or sea salt. Check with your health care provider or pharmacist before using salt substitutes.  Do not fry foods. Cook foods using healthy methods such as baking, boiling, grilling, and broiling instead.  Cook with heart-healthy oils, such as olive, canola, soybean, or sunflower oil. Meal planning   Eat a balanced diet that includes: ? 5 or more servings of fruits and vegetables each day. At each meal, try to fill half of your plate with fruits and vegetables. ? Up to 6-8 servings of whole grains each day. ? Less than 6 oz of lean meat, poultry, or fish each day. A 3-oz serving of meat is about the same size as a deck of cards. One egg equals 1 oz. ? 2 servings of low-fat dairy each day. ?  A serving of nuts, seeds, or beans 5 times each week. ? Heart-healthy fats. Healthy fats called Omega-3 fatty acids are found in foods such as flaxseeds and coldwater fish, like sardines, salmon, and mackerel.  Limit how much you eat of the following: ? Canned or prepackaged foods. ? Food that is high in trans fat, such as fried foods. ? Food that is high in saturated fat, such as fatty meat. ? Sweets, desserts, sugary drinks, and other foods with added sugar. ? Full-fat dairy products.  Do not salt foods before eating.  Try to eat at least 2 vegetarian meals each week.  Eat more home-cooked food and less restaurant, buffet, and fast food.  When eating at a restaurant, ask that your food be prepared with less salt or no salt, if possible. What foods are recommended? The items listed may not be a complete list. Talk with your dietitian about  what dietary choices are best for you. Grains Whole-grain or whole-wheat bread. Whole-grain or whole-wheat pasta. Brown rice. [place]. Bulgur. Whole-grain and low-sodium cereals. Pita bread. Low-fat, low-sodium crackers. Whole-wheat flour tortillas. Vegetables Fresh or frozen vegetables (raw, steamed, roasted, or grilled). Low-sodium or reduced-sodium tomato and vegetable juice. Low-sodium or reduced-sodium tomato sauce and tomato paste. Low-sodium or reduced-sodium canned vegetables. Fruits All fresh, dried, or frozen fruit. Canned fruit in natural juice (without added sugar). Meat and other protein foods Skinless chicken or [place]. Ground chicken or [place]. Pork with fat trimmed off. Fish and seafood. Egg whites. Dried beans, peas, or lentils. Unsalted nuts, nut butters, and seeds. Unsalted canned beans. Lean cuts of beef with fat trimmed off. Low-sodium, lean deli meat. Dairy Low-fat (1%) or fat-free (skim) milk. Fat-free, low-fat, or reduced-fat cheeses. Nonfat, low-sodium ricotta or cottage cheese. Low-fat or nonfat yogurt. Low-fat, low-sodium cheese. Fats and oils Soft margarine without trans fats. Vegetable oil. Low-fat, reduced-fat, or light mayonnaise and salad dressings (reduced-sodium). Canola, safflower, olive, soybean, and sunflower oils. Avocado. Seasoning and other foods Herbs. Spices. Seasoning mixes without salt. Unsalted popcorn and pretzels. Fat-free sweets. What foods are not recommended? The items listed may not be a complete list. Talk with your dietitian about what dietary choices are best for you. Grains Baked goods made with fat, such as croissants, muffins, or some breads. Dry pasta or rice meal packs. Vegetables Creamed or fried vegetables. Vegetables in a cheese sauce. Regular canned vegetables (not low-sodium or reduced-sodium). Regular canned tomato sauce and paste (not low-sodium or reduced-sodium). Regular tomato and vegetable juice (not low-sodium or  reduced-sodium). Angie Fava. Olives. Fruits Canned fruit in a light or heavy syrup. Fried fruit. Fruit in cream or butter sauce. Meat and other protein foods Fatty cuts of meat. Ribs. Fried meat. Berniece Salines. Sausage. Bologna and other processed lunch meats. Salami. Fatback. Hotdogs. Bratwurst. Salted nuts and seeds. Canned beans with added salt. Canned or smoked fish. Whole eggs or egg yolks. Chicken or [place] with skin. Dairy Whole or 2% milk, cream, and half-and-half. Whole or full-fat cream cheese. Whole-fat or sweetened yogurt. Full-fat cheese. Nondairy creamers. Whipped toppings. Processed cheese and cheese spreads. Fats and oils Butter. Stick margarine. Lard. Shortening. Ghee. Bacon fat. Tropical oils, such as coconut, palm kernel, or palm oil. Seasoning and other foods Salted popcorn and pretzels. Onion salt, garlic salt, seasoned salt, table salt, and sea salt. Worcestershire sauce. Tartar sauce. Barbecue sauce. Teriyaki sauce. Soy sauce, including reduced-sodium. Steak sauce. Canned and packaged gravies. Fish sauce. Oyster sauce. Cocktail sauce. Horseradish that you find on the shelf. Ketchup.  Mustard. Meat flavorings and tenderizers. Bouillon cubes. Hot sauce and Tabasco sauce. Premade or packaged marinades. Premade or packaged taco seasonings. Relishes. Regular salad dressings. Where to find more information:  National Heart, Lung, and [place]: https://wilson-eaton.com/  American Heart Association: www.heart.org Summary  The DASH eating plan is a healthy eating plan that has been shown to reduce high blood pressure (hypertension). It may also reduce your risk for type 2 diabetes, heart disease, and stroke.  With the DASH eating plan, you should limit salt (sodium) intake to 2,300 mg a day. If you have hypertension, you may need to reduce your sodium intake to 1,500 mg a day.  When on the DASH eating plan, aim to eat more fresh fruits and vegetables, whole grains, lean proteins, low-fat  dairy, and heart-healthy fats.  Work with your health care provider or diet and nutrition specialist (dietitian) to adjust your eating plan to your individual calorie needs. This information is not intended to replace advice given to you by your health care provider. Make sure you discuss any questions you have with your health care provider. Document Released: 12/05/2011 Document Revised: 12/09/2016 Document Reviewed: 12/09/2016 Elsevier Interactive Patient Education  2018 [employer].   Increase plain water intake, strive for at least 64 oz/day. Follow DADH Diet. Please use Parafon as needed for back spasms. Recommend at least 30 minutes daily, 5 days per week of walking, jogging, biking, swimming, YouTube/Pinterest workout videos. Today your urinalysis is normal today. If "bladder pressure" and urinary frequency persist for another 2 weeks, please call and we will send you for imaging.  Try to reduce nightly wine consumption to no more than two glasses/night. Try to reduce-stop all tobacco use- YOU CAN DO IT! We will you when lab results are available. FEEL BETTER!

## 2018-12-14 NOTE — Assessment & Plan Note (Signed)
Increase plain water intake, strive for at least 64 oz/day. Follow DADH Diet. Please use Parafon as needed for back spasms. Recommend at least 30 minutes daily, 5 days per week of walking, jogging, biking, swimming, YouTube/Pinterest workout videos. Today your urinalysis is normal today. If "bladder pressure" and urinary frequency persist for another 2 weeks, please call and we will send you for imaging.  Try to reduce nightly wine consumption to no more than two glasses/night. Try to reduce-stop all tobacco use- YOU CAN DO IT! We will you when lab results are available.

## 2018-12-15 ENCOUNTER — Telehealth: Payer: Self-pay

## 2018-12-15 LAB — COMPREHENSIVE METABOLIC PANEL
A/G RATIO: 2.1 (ref 1.2–2.2)
ALT: 24 IU/L (ref 0–32)
AST: 22 IU/L (ref 0–40)
Albumin: 4.1 g/dL (ref 3.5–5.5)
Alkaline Phosphatase: 96 IU/L (ref 39–117)
BILIRUBIN TOTAL: 0.3 mg/dL (ref 0.0–1.2)
BUN/Creatinine Ratio: 13 (ref 9–23)
BUN: 10 mg/dL (ref 6–24)
CALCIUM: 9.2 mg/dL (ref 8.7–10.2)
CHLORIDE: 105 mmol/L (ref 96–106)
CO2: 20 mmol/L (ref 20–29)
Creatinine, Ser: 0.76 mg/dL (ref 0.57–1.00)
GFR, EST AFRICAN AMERICAN: 107 mL/min/{1.73_m2} (ref >59)
GFR, EST NON AFRICAN AMERICAN: 93 mL/min/{1.73_m2} (ref >59)
GLOBULIN, TOTAL: 2 g/dL (ref 1.5–4.5)
Glucose: 92 mg/dL (ref 65–99)
POTASSIUM: 4.3 mmol/L (ref 3.5–5.2)
SODIUM: 139 mmol/L (ref 134–144)
Total Protein: 6.1 g/dL (ref 6.0–8.5)

## 2018-12-15 LAB — CBC WITH DIFFERENTIAL/PLATELET
BASOS: 0 %
Basophils Absolute: 0 10*3/uL (ref 0.0–0.2)
EOS (ABSOLUTE): 0.1 10*3/uL (ref 0.0–0.4)
EOS: 1 %
HEMATOCRIT: 42.1 % (ref 34.0–46.6)
Hemoglobin: 14.5 g/dL (ref 11.1–15.9)
Immature Grans (Abs): 0 10*3/uL (ref 0.0–0.1)
Immature Granulocytes: 0 %
Lymphocytes Absolute: 2 10*3/uL (ref 0.7–3.1)
Lymphs: 25 %
MCH: 37 pg — AB (ref 26.6–33.0)
MCHC: 34.4 g/dL (ref 31.5–35.7)
MCV: 107 fL — AB (ref 79–97)
MONOS ABS: 0.7 10*3/uL (ref 0.1–0.9)
Monocytes: 9 %
NEUTROS ABS: 5.1 10*3/uL (ref 1.4–7.0)
Neutrophils: 65 %
PLATELETS: 297 10*3/uL (ref 150–450)
RBC: 3.92 x10E6/uL (ref 3.77–5.28)
RDW: 12.1 % — AB (ref 12.3–15.4)
WBC: 7.9 10*3/uL (ref 3.4–10.8)

## 2018-12-15 MED ORDER — CYCLOBENZAPRINE HCL 10 MG PO TABS: 10.0000 mg | ORAL_TABLET | Freq: Every evening | ORAL | 0 refills | Status: DC | PRN

## 2018-12-15 NOTE — Telephone Encounter (Signed)
Received fax from pharmacy that Chlorzoxazone is not covered by pt's health insurance.  New RX for cyclobenzaprine sent to pharmacy per Mina Marble, NP.  Charyl Bigger, CMA

## 2018-12-16 LAB — B12 AND FOLATE PANEL
Folate: >20 ng/mL (ref >3.0)
Vitamin B-12: 366 pg/mL (ref 232–1245)

## 2018-12-16 LAB — IRON: Iron: 129 ug/dL (ref 27–159)

## 2018-12-16 LAB — SPECIMEN STATUS REPORT

## 2018-12-16 LAB — FERRITIN: Ferritin: 172 ng/mL — ABNORMAL HIGH (ref 15–150)

## 2018-12-16 LAB — TRANSFERRIN: Transferrin: 200 mg/dL (ref 200–370)

## 2019-03-09 NOTE — Progress Notes (Deleted)
Subjective:    Patient ID: Allison Wallace, female    DOB: Feb 24, 1971, 48 y.o.   MRN: [id number]  HPI:  03/15/2019 OV: [name] is here for 3 month f/u:HLD, Fatigue,    Patient Care Team    Relationship Specialty Notifications Start End  Esaw Grandchild, NP PCP - General Family Medicine  05/13/17   Allyn Kenner, MD  Dermatology  05/13/17   Olga Millers, MD Consulting Physician Obstetrics and Gynecology  05/13/17     Patient Active Problem List   Diagnosis Date Noted  . Urinary frequency 12/14/2018  . Flank pain 09/09/2018  . Spasm of thoracic back muscle 09/09/2018  . Elevated LDL cholesterol level 07/06/2018  . Abnormal CBC 07/06/2018  . 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 Tremor of unknown origin 07/06/2018  . Family history of high cholesterol 05/13/2017  . Fatigue 05/13/2017  . Healthcare maintenance 05/13/2017  . Tobacco use disorder 05/13/2017     No past medical history on file.   No past surgical history on file.   Family History  Problem Relation Age of Onset  . Hyperlipidemia Father      Social History   Substance and Sexual Activity  Drug Use No     Social History   Substance and Sexual Activity  Alcohol Use Yes  . Alcohol/week: 4.0 standard drinks  . Types: 4 Glasses of wine per week     Social History   Tobacco Use  Smoking Status Current Every Day Smoker  . Types: Cigarettes  Smokeless Tobacco Current User  Tobacco Comment   1 pack every 2 days     Outpatient Encounter Medications as of 03/15/2019  Medication Sig  . cyclobenzaprine (FLEXERIL) 10 MG tablet Take 1 tablet (10 mg total) by mouth at bedtime as needed for muscle spasms.  . Ivermectin (SOOLANTRA) 1 % CREA Apply topically.  . Multiple Vitamin (ONE-A-DAY ADULT VITACRAVES+DHA) CHEW Chew by mouth.   No facility-administered encounter medications on file as of 03/15/2019.     Allergies: Patient has no known allergies.  There is no height or weight on file to  calculate BMI.  There were no vitals taken for this visit.  Review of Systems  Constitutional: Positive for activity change and fatigue. Negative for appetite change, chills, diaphoresis, fever and unexpected weight change.  Eyes: Negative for visual disturbance.  Respiratory: Negative for cough, chest tightness, shortness of breath, wheezing and stridor.   Cardiovascular: Negative for chest pain, palpitations and leg swelling.  Gastrointestinal: Negative for abdominal distention, abdominal pain, blood in stool, constipation, diarrhea, nausea and vomiting.  Endocrine: Negative for cold intolerance, heat intolerance, polydipsia, polyphagia and polyuria.  Genitourinary: Positive for frequency. Negative for dysuria and flank pain.  Musculoskeletal: Positive for arthralgias, back pain and myalgias.  Skin: Negative for color change, pallor, rash and wound.  Neurological: Negative for dizziness and headaches.  Psychiatric/Behavioral: Positive for sleep disturbance. Negative for agitation, behavioral problems, confusion, decreased concentration, dysphoric mood, hallucinations, self-injury and suicidal ideas. The patient is nervous/anxious. The patient is not hyperactive.        Objective:   Physical Exam Constitutional:      Appearance: Normal appearance.  HENT:     Head: Normocephalic and atraumatic.  Neck:     Musculoskeletal: Normal range of motion and neck supple.  Cardiovascular:     Rate and Rhythm: Normal rate and regular rhythm.     Pulses: Normal pulses.     Heart sounds: Normal heart sounds. No  murmur.  Pulmonary:     Effort: Pulmonary effort is normal. No respiratory distress.     Breath sounds: Normal breath sounds. No stridor. No wheezing, rhonchi or rales.  Chest:     Chest wall: No tenderness.  Abdominal:     General: Abdomen is flat. Bowel sounds are normal. There is no distension.     Palpations: Abdomen is soft.     Tenderness: There is no right CVA tenderness.   Skin:    Capillary Refill: Capillary refill takes less than 2 seconds.  Neurological:     Mental Status: She is alert and oriented to person, place, and time.     Motor: Tremor present.     Comments: Fine generalized tremor noted  Psychiatric:        Attention and Perception: Attention normal.        Mood and Affect: Mood is anxious.        Speech: Speech normal.        Behavior: Behavior normal.        Thought Content: Thought content normal.        Cognition and Memory: Cognition and memory normal.        Judgment: Judgment normal.       Assessment & Plan:   No diagnosis found.  No problem-specific Assessment & Plan notes found for this encounter.  Pt was in the office today for 25+ minutes, I spent >75% of time in face to face counseling of patient's various medical conditions and in coordination of care  FOLLOW-UP:  No follow-ups on file.

## 2019-03-15 ENCOUNTER — Ambulatory Visit: Payer: Managed Care, Other (non HMO) | Admitting: Adult Health

## 2019-10-22 ENCOUNTER — Other Ambulatory Visit: Payer: Self-pay

## 2019-10-22 ENCOUNTER — Other Ambulatory Visit: Payer: Managed Care, Other (non HMO)

## 2019-10-22 DIAGNOSIS — Z0000 Encounter for general adult medical examination without abnormal findings: Secondary | ICD-10-CM

## 2019-10-22 DIAGNOSIS — E7800 Pure hypercholesterolemia, unspecified: Secondary | ICD-10-CM

## 2019-10-22 DIAGNOSIS — R7989 Other specified abnormal findings of blood chemistry: Secondary | ICD-10-CM

## 2019-10-23 LAB — CBC WITH DIFFERENTIAL/PLATELET
Basophils Absolute: 0 10*3/uL (ref 0.0–0.2)
Basos: 0 %
EOS (ABSOLUTE): 0.1 10*3/uL (ref 0.0–0.4)
Eos: 1 %
Hematocrit: 48.2 % — ABNORMAL HIGH (ref 34.0–46.6)
Hemoglobin: 16.7 g/dL — ABNORMAL HIGH (ref 11.1–15.9)
Immature Grans (Abs): 0 10*3/uL (ref 0.0–0.1)
Immature Granulocytes: 1 %
Lymphocytes Absolute: 1.8 10*3/uL (ref 0.7–3.1)
Lymphs: 22 %
MCH: 36.9 pg — ABNORMAL HIGH (ref 26.6–33.0)
MCHC: 34.6 g/dL (ref 31.5–35.7)
MCV: 107 fL — ABNORMAL HIGH (ref 79–97)
Monocytes Absolute: 0.7 10*3/uL (ref 0.1–0.9)
Monocytes: 8 %
Neutrophils Absolute: 5.8 10*3/uL (ref 1.4–7.0)
Neutrophils: 68 %
Platelets: 248 10*3/uL (ref 150–450)
RBC: 4.52 x10E6/uL (ref 3.77–5.28)
RDW: 12 % (ref 11.7–15.4)
WBC: 8.4 10*3/uL (ref 3.4–10.8)

## 2019-10-23 LAB — COMPREHENSIVE METABOLIC PANEL
ALT: 19 IU/L (ref 0–32)
AST: 19 IU/L (ref 0–40)
Albumin/Globulin Ratio: 2 (ref 1.2–2.2)
Albumin: 4.1 g/dL (ref 3.8–4.8)
Alkaline Phosphatase: 110 IU/L (ref 39–117)
BUN/Creatinine Ratio: 12 (ref 9–23)
BUN: 9 mg/dL (ref 6–24)
Bilirubin Total: 0.4 mg/dL (ref 0.0–1.2)
CO2: 24 mmol/L (ref 20–29)
Calcium: 9.4 mg/dL (ref 8.7–10.2)
Chloride: 107 mmol/L — ABNORMAL HIGH (ref 96–106)
Creatinine, Ser: 0.77 mg/dL (ref 0.57–1.00)
GFR calc Af Amer: 105 mL/min/{1.73_m2} (ref >59)
GFR calc non Af Amer: 91 mL/min/{1.73_m2} (ref >59)
Globulin, Total: 2.1 g/dL (ref 1.5–4.5)
Glucose: 96 mg/dL (ref 65–99)
Potassium: 4.5 mmol/L (ref 3.5–5.2)
Sodium: 141 mmol/L (ref 134–144)
Total Protein: 6.2 g/dL (ref 6.0–8.5)

## 2019-10-23 LAB — LIPID PANEL
Chol/HDL Ratio: 3.7 ratio (ref 0.0–4.4)
Cholesterol, Total: 191 mg/dL (ref 100–199)
HDL: 51 mg/dL (ref >39)
LDL Chol Calc (NIH): 120 mg/dL — ABNORMAL HIGH (ref 0–99)
Triglycerides: 109 mg/dL (ref 0–149)
VLDL Cholesterol Cal: 20 mg/dL (ref 5–40)

## 2019-10-23 LAB — HEMOGLOBIN A1C
Est. average glucose Bld gHb Est-mCnc: 100 mg/dL
Hgb A1c MFr Bld: 5.1 % (ref 4.8–5.6)

## 2019-10-23 LAB — TSH: TSH: 1.37 u[IU]/mL (ref 0.450–4.500)

## 2019-10-26 NOTE — Progress Notes (Signed)
Subjective:    Patient ID: Allison Wallace, female    DOB: 1971-07-05, 48 y.o.   MRN: [id number]  HPI:07/06/18 OV: Allison Wallace presents for CPE She estimates to drink 16-20 oz water/day, prefers to hydrate with sweat tea. She denies regular exercise, however has [employer] She reports walking frequently at work. She continues to use tobacco- pack/day, declined smoking cessation today She reports increase in size/nnumber of upper extremity lipomas She reports tremor of head and LLE for "at least several years" and reports an aunt and her son with similar sx's She reports drinking 3-4 glasses wine/night She denies ETOH interfering with work responsibilities or interpersonal relationships. She denies hx of DUIs  Reviewed most recent labs, notables: LDL- 116 CBC- minor bump in hemoglobin, MCV, MCH-remains elevated  Healthcare Maintenance: PAP-UTD, will request from OB/GYN Mammogram- pt will schedule Immunizations- Tdap updated today  10/27/2019 OV: Allison Wallace is here for CPE She has been working >50 hrs/week She reports limited time to exercise, therefore has not. She continues to smoke 1/2 pack per day- declined smoking cessation today She continues to drink 3-4 glasses of wine per day. She reports a diet high in saturated fat/Na+  10/22/2019 Labs: TSH-1.370 A1c-5.1  CMP-stable  CBC-slight elevation in H/H- likely r/t to tobacco use  Has had elevated MCH and MCH levels in past-denies known hx of family blood disorders  The 10-year ASCVD risk score Mikey Bussing DC Jr., et al., 2013) is: 3.6%  Values used to calculate the score:   Age: 68 years   Sex: Female   Is Non-Hispanic African American: No   Diabetic: No   Tobacco smoker: Yes   Systolic Blood Pressure: [id number] mmHg   Is BP treated: No   HDL Cholesterol: 51 mg/dL   Total Cholesterol: 191 mg/dL  LDL-120  06/2018 LDL-116 Lifestyle encouraged  Father had [place]  Maintenance: PAP-08/26/2018-normal Mammogram-ordered Immunizations-UTD  Patient Care Team    Relationship Specialty Notifications Start End  Mina Marble D, NP PCP - General Family Medicine  05/13/17   Allyn Kenner, MD  Dermatology  05/13/17   Olga Millers, MD Consulting Physician Obstetrics and Gynecology  05/13/17     Patient Active Problem List   Diagnosis Date Noted  . GERD (gastroesophageal reflux disease) 10/27/2019  . Urinary frequency 12/14/2018  . Flank pain 09/09/2018  . Spasm of thoracic back muscle 09/09/2018  . Elevated LDL cholesterol level 07/06/2018  . Abnormal CBC 07/06/2018  . 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 Tremor of unknown origin 07/06/2018  . Family history of high cholesterol 05/13/2017  . Fatigue 05/13/2017  . Healthcare maintenance 05/13/2017  . Tobacco use disorder 05/13/2017     History reviewed. No pertinent past medical history.   History reviewed. No pertinent surgical history.   Family History  Problem Relation Age of Onset  . Hyperlipidemia Father      Social History   Substance and Sexual Activity  Drug Use No     Social History   Substance and Sexual Activity  Alcohol Use Yes  . Alcohol/week: 4.0 standard drinks  . Types: 4 Glasses of wine per week     Social History   Tobacco Use  Smoking Status Current Every Day Smoker  . Types: Cigarettes  Smokeless Tobacco Current User  Tobacco Comment   1 pack every 2 days     Outpatient Encounter Medications as of 10/27/2019  Medication Sig  . cyclobenzaprine (FLEXERIL) 10 MG tablet Take 1 tablet (10 mg  total) by mouth at bedtime as needed for muscle spasms.  . Ivermectin (SOOLANTRA) 1 % CREA Apply topically.  . Multiple Vitamin (ONE-A-DAY ADULT VITACRAVES+DHA) CHEW Chew by mouth.  Marland Kitchen omeprazole (PRILOSEC) 20 MG capsule Take 1 capsule (20 mg total) by mouth daily.   No facility-administered encounter medications on file as of 10/27/2019.     Allergies: Patient has no  known allergies.  Body mass index is 28.39 kg/m.  Blood pressure (!) 147/100, pulse 88, temperature 98.6 F (37 C), temperature source Oral, height 5' 7.5" (1.715 m), weight 184 lb (83.5 kg), last menstrual period 09/29/2019, SpO2 96 %.  Review of Systems  Constitutional: Positive for fatigue. Negative for activity change, appetite change, chills, diaphoresis, fever and unexpected weight change.  HENT: Negative for congestion.   Eyes: Negative for visual disturbance.  Respiratory: Negative for cough, chest tightness, shortness of breath, wheezing and stridor.   Cardiovascular: Negative for chest pain, palpitations and leg swelling.  Gastrointestinal: Negative for abdominal distention, anal bleeding, blood in stool, constipation, diarrhea, nausea and vomiting.       Increase in GERD  Endocrine: Negative for polydipsia, polyphagia and polyuria.  Genitourinary: Negative for difficulty urinating and flank pain.  Musculoskeletal: Negative for arthralgias, back pain, gait problem, joint swelling, myalgias, neck pain and neck stiffness.  Neurological: Positive for tremors. Negative for dizziness and headaches.  Hematological: Negative for adenopathy. Does not bruise/bleed easily.  Psychiatric/Behavioral: Negative for agitation, behavioral problems, confusion, decreased concentration, dysphoric mood, hallucinations, self-injury, sleep disturbance and suicidal ideas. The patient is not nervous/anxious and is not hyperactive.        Objective:   Physical Exam Vitals signs and nursing note reviewed.  Constitutional:      General: She is not in acute distress.    Appearance: Normal appearance. She is not ill-appearing, toxic-appearing or diaphoretic.  HENT:     Head: Normocephalic and atraumatic.     Right Ear: Tympanic membrane, ear canal and external ear normal. There is no impacted cerumen.     Left Ear: Tympanic membrane, ear canal and external ear normal. There is no impacted cerumen.      Nose: Nose normal. No congestion.     Mouth/Throat:     Mouth: Mucous membranes are moist.     Pharynx: No oropharyngeal exudate.  Eyes:     Extraocular Movements: Extraocular movements intact.     Conjunctiva/sclera: Conjunctivae normal.     Pupils: Pupils are equal, round, and reactive to light.  Neck:     Musculoskeletal: Normal range of motion and neck supple. No muscular tenderness.  Cardiovascular:     Rate and Rhythm: Normal rate and regular rhythm.     Pulses: Normal pulses.     Heart sounds: Normal heart sounds. No murmur. No friction rub. No gallop.   Pulmonary:     Effort: Pulmonary effort is normal. No respiratory distress.     Breath sounds: Normal breath sounds. No stridor. No wheezing, rhonchi or rales.  Chest:     Chest wall: No tenderness.  Abdominal:     General: Abdomen is protuberant. Bowel sounds are normal.     Palpations: Abdomen is soft.     Tenderness: There is no abdominal tenderness. There is no right CVA tenderness, left CVA tenderness, guarding or rebound.  Musculoskeletal: Normal range of motion.        General: No tenderness.  Skin:    General: Skin is warm and dry.     Capillary Refill: Capillary  refill takes less than 2 seconds.     Comments: Ruddy facial appearance   Neurological:     Mental Status: She is alert and oriented to person, place, and time.     Coordination: Coordination normal.  Psychiatric:        Attention and Perception: Attention and perception normal.        Mood and Affect: Mood is anxious.        Speech: Speech normal.        Behavior: Behavior normal.        Thought Content: Thought content normal.        Cognition and Memory: Cognition and memory normal.        Judgment: Judgment normal.       Assessment & Plan:   1. Encounter for screening mammogram for malignant neoplasm of breast   2. Gastroesophageal reflux disease, unspecified whether esophagitis present   3. Healthcare maintenance   4. Tobacco use disorder    5. Abnormal CBC     GERD (gastroesophageal reflux disease) Encouraged to stop tobacco and dramatically reduce ETOH use (max one drink/day). Omeprazole 20mg  QD, 30 count with one refill Referral to GI placed  Healthcare maintenance Increase plain water intake, strive to drink at least half of your body weight in ounces per day. Decrease wine intake, per safe drinking guidelines 1 drink per day max for women. Follow DASH diet. Increase daily walking. Please check blood pressure and heart rate daily- bring log to follow-up in 2weeks. Try to reduce to stop tobacco use- you can do it! Referral placed to Gastroenterologist. Follow-up two weeks.  Tobacco use disorder 1/2 pack per pday Declined tobacco cessation   Abnormal CBC CBC-slight elevation in H/H- likely r/t to tobacco use  Has had elevated MCH and MCH levels in past-denies known hx of family blood disorders    FOLLOW-UP:  Return in about 2 weeks (around 11/10/2019).

## 2019-10-27 ENCOUNTER — Ambulatory Visit (INDEPENDENT_AMBULATORY_CARE_PROVIDER_SITE_OTHER): Payer: Managed Care, Other (non HMO) | Admitting: Adult Health

## 2019-10-27 ENCOUNTER — Other Ambulatory Visit: Payer: Self-pay

## 2019-10-27 ENCOUNTER — Encounter: Payer: Self-pay | Admitting: Adult Health

## 2019-10-27 VITALS — BP 147/100 | HR 88 | Temp 98.6°F | Ht 67.5 in | Wt 184.0 lb

## 2019-10-27 DIAGNOSIS — Z1231 Encounter for screening mammogram for malignant neoplasm of breast: Secondary | ICD-10-CM | POA: Diagnosis not present

## 2019-10-27 DIAGNOSIS — K219 Gastro-esophageal reflux disease without esophagitis: Secondary | ICD-10-CM | POA: Insufficient documentation

## 2019-10-27 DIAGNOSIS — Z0000 Encounter for general adult medical examination without abnormal findings: Secondary | ICD-10-CM

## 2019-10-27 DIAGNOSIS — R7989 Other specified abnormal findings of blood chemistry: Secondary | ICD-10-CM

## 2019-10-27 DIAGNOSIS — F17200 Nicotine dependence, unspecified, uncomplicated: Secondary | ICD-10-CM

## 2019-10-27 MED ORDER — OMEPRAZOLE 20 MG PO CPDR: 20.0000 mg | DELAYED_RELEASE_CAPSULE | Freq: Every day | ORAL | 1 refills | Status: DC

## 2019-10-27 NOTE — Patient Instructions (Addendum)
Preventive Care for Adults, Female  A healthy lifestyle and preventive care can promote health and wellness. Preventive health guidelines for women include the following key practices.   A routine yearly physical is a good way to check with your health care provider about your health and preventive screening. It is a chance to share any concerns and updates on your health and to receive a thorough exam.   Visit your dentist for a routine exam and preventive care every 6 months. Brush your teeth twice a day and floss once a day. Good oral hygiene prevents tooth decay and gum disease.   The frequency of eye exams is based on your age, health, family medical history, use of contact lenses, and other factors. Follow your health care provider's recommendations for frequency of eye exams.   Eat a healthy diet. Foods like vegetables, fruits, whole grains, low-fat dairy products, and lean protein foods contain the nutrients you need without too many calories. Decrease your intake of foods high in solid fats, added sugars, and salt. Eat the right amount of calories for you.Get information about a proper diet from your health care provider, if necessary.   Regular physical exercise is one of the most important things you can do for your health. Most adults should get at least 150 minutes of moderate-intensity exercise (any activity that increases your heart rate and causes you to sweat) each week. In addition, most adults need muscle-strengthening exercises on 2 or more days a week.   Maintain a healthy weight. The body mass index (BMI) is a screening tool to identify possible weight problems. It provides an estimate of body fat based on height and weight. Your health care provider can find your BMI, and can help you achieve or maintain a healthy weight.For adults 20 years and older:   - A BMI below 18.5 is considered underweight.   - A BMI of 18.5 to 24.9 is normal.   - A BMI of 25 to 29.9 is  considered overweight.   - A BMI of 30 and above is considered obese.   Maintain normal blood lipids and cholesterol levels by exercising and minimizing your intake of trans and saturated fats.  Eat a balanced diet with plenty of fruit and vegetables. Blood tests for lipids and cholesterol should begin at age 20 and be repeated every 5 years minimum.  If your lipid or cholesterol levels are high, you are over 40, or you are at high risk for heart disease, you may need your cholesterol levels checked more frequently.Ongoing high lipid and cholesterol levels should be treated with medicines if diet and exercise are not working.   If you smoke, find out from your health care provider how to quit. If you do not use tobacco, do not start.   Lung cancer screening is recommended for adults aged 55-80 years who are at high risk for developing lung cancer because of a history of smoking. A yearly low-dose CT scan of the lungs is recommended for people who have at least a 30-pack-year history of smoking and are a current smoker or have quit within the past 15 years. A pack year of smoking is smoking an average of 1 pack of cigarettes a day for 1 year (for example: 1 pack a day for 30 years or 2 packs a day for 15 years). Yearly screening should continue until the smoker has stopped smoking for at least 15 years. Yearly screening should be stopped for people who develop a   health problem that would prevent them from having lung cancer treatment.   If you are pregnant, do not drink alcohol. If you are breastfeeding, be very cautious about drinking alcohol. If you are not pregnant and choose to drink alcohol, do not have more than 1 drink per day. One drink is considered to be 12 ounces (355 mL) of beer, 5 ounces (148 mL) of wine, or 1.5 ounces (44 mL) of liquor.   Avoid use of street drugs. Do not share needles with anyone. Ask for help if you need support or instructions about stopping the use of  drugs.   High blood pressure causes heart disease and increases the risk of stroke. Your blood pressure should be checked at least yearly.  Ongoing high blood pressure should be treated with medicines if weight loss and exercise do not work.   If you are 69-55 years old, ask your health care provider if you should take aspirin to prevent strokes.   Diabetes screening involves taking a blood sample to check your fasting blood sugar level. This should be done once every 3 years, after age 38, if you are within normal weight and without risk factors for diabetes. Testing should be considered at a younger age or be carried out more frequently if you are overweight and have at least 1 risk factor for diabetes.   Breast cancer screening is essential preventive care for women. You should practice "breast self-awareness."  This means understanding the normal appearance and feel of your breasts and may include breast self-examination.  Any changes detected, no matter how small, should be reported to a health care provider.  Women in their 80s and 30s should have a clinical breast exam (CBE) by a health care provider as part of a regular health exam every 1 to 3 years.  After age 66, women should have a CBE every year.  Starting at age 1, women should consider having a mammogram (breast X-ray test) every year.  Women who have a family history of breast cancer should talk to their health care provider about genetic screening.  Women at a high risk of breast cancer should talk to their health care providers about having an MRI and a mammogram every year.   -Breast cancer gene (BRCA)-related cancer risk assessment is recommended for women who have family members with BRCA-related cancers. BRCA-related cancers include breast, ovarian, tubal, and peritoneal cancers. Having family members with these cancers may be associated with an increased risk for harmful changes (mutations) in the breast cancer genes BRCA1 and  BRCA2. Results of the assessment will determine the need for genetic counseling and BRCA1 and BRCA2 testing.   The Pap test is a screening test for cervical cancer. A Pap test can show cell changes on the cervix that might become cervical cancer if left untreated. A Pap test is a procedure in which cells are obtained and examined from the lower end of the uterus (cervix).   - Women should have a Pap test starting at age 57.   - Between ages 90 and 70, Pap tests should be repeated every 2 years.   - Beginning at age 63, you should have a Pap test every 3 years as long as the past 3 Pap tests have been normal.   - Some women have medical problems that increase the chance of getting cervical cancer. Talk to your health care provider about these problems. It is especially important to talk to your health care provider if a  new problem develops soon after your last Pap test. In these cases, your health care provider may recommend more frequent screening and Pap tests.   - The above recommendations are the same for women who have or have not gotten the vaccine for human papillomavirus (HPV).   - If you had a hysterectomy for a problem that was not cancer or a condition that could lead to cancer, then you no longer need Pap tests. Even if you no longer need a Pap test, a regular exam is a good idea to make sure no other problems are starting.   - If you are between ages 36 and 66 years, and you have had normal Pap tests going back 10 years, you no longer need Pap tests. Even if you no longer need a Pap test, a regular exam is a good idea to make sure no other problems are starting.   - If you have had past treatment for cervical cancer or a condition that could lead to cancer, you need Pap tests and screening for cancer for at least 20 years after your treatment.   - If Pap tests have been discontinued, risk factors (such as a new sexual partner) need to be reassessed to determine if screening should  be resumed.   - The HPV test is an additional test that may be used for cervical cancer screening. The HPV test looks for the virus that can cause the cell changes on the cervix. The cells collected during the Pap test can be tested for HPV. The HPV test could be used to screen women aged 70 years and older, and should be used in women of any age who have unclear Pap test results. After the age of 67, women should have HPV testing at the same frequency as a Pap test.   Colorectal cancer can be detected and often prevented. Most routine colorectal cancer screening begins at the age of 57 years and continues through age 26 years. However, your health care provider may recommend screening at an earlier age if you have risk factors for colon cancer. On a yearly basis, your health care provider may provide home test kits to check for hidden blood in the stool.  Use of a small camera at the end of a tube, to directly examine the colon (sigmoidoscopy or colonoscopy), can detect the earliest forms of colorectal cancer. Talk to your health care provider about this at age 23, when routine screening begins. Direct exam of the colon should be repeated every 5 -10 years through age 49 years, unless early forms of pre-cancerous polyps or small growths are found.   People who are at an increased risk for hepatitis B should be screened for this virus. You are considered at high risk for hepatitis B if:  -You were born in a country where hepatitis B occurs often. Talk with your health care provider about which countries are considered high risk.  - Your parents were born in a high-risk country and you have not received a shot to protect against hepatitis B (hepatitis B vaccine).  - You have HIV or AIDS.  - You use needles to inject street drugs.  - You live with, or have sex with, someone who has Hepatitis B.  - You get hemodialysis treatment.  - You take certain medicines for conditions like cancer, organ  transplantation, and autoimmune conditions.   Hepatitis C blood testing is recommended for all people born from [birth year] through 1965 and any individual  with known risks for hepatitis C.   Practice safe sex. Use condoms and avoid high-risk sexual practices to reduce the spread of sexually transmitted infections (STIs). STIs include gonorrhea, chlamydia, syphilis, trichomonas, herpes, HPV, and human immunodeficiency virus (HIV). Herpes, HIV, and HPV are viral illnesses that have no cure. They can result in disability, cancer, and death. Sexually active women aged 25 years and younger should be checked for chlamydia. Older women with new or multiple partners should also be tested for chlamydia. Testing for other STIs is recommended if you are sexually active and at increased risk.   Osteoporosis is a disease in which the bones lose minerals and strength with aging. This can result in serious bone fractures or breaks. The risk of osteoporosis can be identified using a bone density scan. Women ages 65 years and over and women at risk for fractures or osteoporosis should discuss screening with their health care providers. Ask your health care provider whether you should take a calcium supplement or vitamin D to There are also several preventive steps women can take to avoid osteoporosis and resulting fractures or to keep osteoporosis from worsening. -->Recommendations include:  Eat a balanced diet high in fruits, vegetables, calcium, and vitamins.  Get enough calcium. The recommended total intake of is 1,200 mg daily; for best absorption, if taking supplements, divide doses into 250-500 mg doses throughout the day. Of the two types of calcium, calcium carbonate is best absorbed when taken with food but calcium citrate can be taken on an empty stomach.  Get enough vitamin D. NAMS and the National Osteoporosis Foundation recommend at least 1,000 IU per day for women age 50 and over who are at risk of vitamin D  deficiency. Vitamin D deficiency can be caused by inadequate sun exposure (for example, those who live in northern latitudes).  Avoid alcohol and smoking. Heavy alcohol intake (more than 7 drinks per week) increases the risk of falls and hip fracture and women smokers tend to lose bone more rapidly and have lower bone mass than nonsmokers. Stopping smoking is one of the most important changes women can make to improve their health and decrease risk for disease.  Be physically active every day. Weight-bearing exercise (for example, fast walking, hiking, jogging, and weight training) may strengthen bones or slow the rate of bone loss that comes with aging. Balancing and muscle-strengthening exercises can reduce the risk of falling and fracture.  Consider therapeutic medications. Currently, several types of effective drugs are available. Healthcare providers can recommend the type most appropriate for each woman.  Eliminate environmental factors that may contribute to accidents. Falls cause nearly 90% of all osteoporotic fractures, so reducing this risk is an important bone-health strategy. Measures include ample lighting, removing obstructions to walking, using nonskid rugs on floors, and placing mats and/or grab bars in showers.  Be aware of medication side effects. Some common medicines make bones weaker. These include a type of steroid drug called glucocorticoids used for arthritis and asthma, some antiseizure drugs, certain sleeping pills, treatments for endometriosis, and some cancer drugs. An overactive thyroid gland or using too much thyroid hormone for an underactive thyroid can also be a problem. If you are taking these medicines, talk to your doctor about what you can do to help protect your bones.reduce the rate of osteoporosis.    Menopause can be associated with physical symptoms and risks. Hormone replacement therapy is available to decrease symptoms and risks. You should talk to your  health care provider   about whether hormone replacement therapy is right for you.   Use sunscreen. Apply sunscreen liberally and repeatedly throughout the day. You should seek shade when your shadow is shorter than you. Protect yourself by wearing long sleeves, pants, a wide-brimmed hat, and sunglasses year round, whenever you are outdoors.   Once a month, do a whole body skin exam, using a mirror to look at the skin on your back. Tell your health care provider of new moles, moles that have irregular borders, moles that are larger than a pencil eraser, or moles that have changed in shape or color.   -Stay current with required vaccines (immunizations).   Influenza vaccine. All adults should be immunized every year.  Tetanus, diphtheria, and acellular pertussis (Td, Tdap) vaccine. Pregnant women should receive 1 dose of Tdap vaccine during each pregnancy. The dose should be obtained regardless of the length of time since the last dose. Immunization is preferred during the 27th 36th week of gestation. An adult who has not previously received Tdap or who does not know her vaccine status should receive 1 dose of Tdap. This initial dose should be followed by tetanus and diphtheria toxoids (Td) booster doses every 10 years. Adults with an unknown or incomplete history of completing a 3-dose immunization series with Td-containing vaccines should begin or complete a primary immunization series including a Tdap dose. Adults should receive a Td booster every 10 years.  Varicella vaccine. An adult without evidence of immunity to varicella should receive 2 doses or a second dose if she has previously received 1 dose. Pregnant females who do not have evidence of immunity should receive the first dose after pregnancy. This first dose should be obtained before leaving the health care facility. The second dose should be obtained 4 8 weeks after the first dose.  Human papillomavirus (HPV) vaccine. Females aged 13 26  years who have not received the vaccine previously should obtain the 3-dose series. The vaccine is not recommended for use in pregnant females. However, pregnancy testing is not needed before receiving a dose. If a female is found to be pregnant after receiving a dose, no treatment is needed. In that case, the remaining doses should be delayed until after the pregnancy. Immunization is recommended for any person with an immunocompromised condition through the age of 26 years if she did not get any or all doses earlier. During the 3-dose series, the second dose should be obtained 4 8 weeks after the first dose. The third dose should be obtained 24 weeks after the first dose and 16 weeks after the second dose.  Zoster vaccine. One dose is recommended for adults aged 60 years or older unless certain conditions are present.  Measles, mumps, and rubella (MMR) vaccine. Adults born before 1957 generally are considered immune to measles and mumps. Adults born in 1957 or later should have 1 or more doses of MMR vaccine unless there is a contraindication to the vaccine or there is laboratory evidence of immunity to each of the three diseases. A routine second dose of MMR vaccine should be obtained at least 28 days after the first dose for students attending postsecondary schools, health care workers, or international travelers. People who received inactivated measles vaccine or an unknown type of measles vaccine during 1963 1967 should receive 2 doses of MMR vaccine. People who received inactivated mumps vaccine or an unknown type of mumps vaccine before 1979 and are at high risk for mumps infection should consider immunization with 2 doses of   MMR vaccine. For females of childbearing age, rubella immunity should be determined. If there is no evidence of immunity, females who are not pregnant should be vaccinated. If there is no evidence of immunity, females who are pregnant should delay immunization until after pregnancy.  Unvaccinated health care workers born before [birth year] who lack laboratory evidence of measles, mumps, or rubella immunity or laboratory confirmation of disease should consider measles and mumps immunization with 2 doses of MMR vaccine or rubella immunization with 1 dose of MMR vaccine.  Pneumococcal 13-valent conjugate (PCV13) vaccine. When indicated, a person who is uncertain of her immunization history and has no record of immunization should receive the PCV13 vaccine. An adult aged 54 years or older who has certain medical conditions and has not been previously immunized should receive 1 dose of PCV13 vaccine. This PCV13 should be followed with a dose of pneumococcal polysaccharide (PPSV23) vaccine. The PPSV23 vaccine dose should be obtained at least 8 weeks after the dose of PCV13 vaccine. An adult aged 58 years or older who has certain medical conditions and previously received 1 or more doses of PPSV23 vaccine should receive 1 dose of PCV13. The PCV13 vaccine dose should be obtained 1 or more years after the last PPSV23 vaccine dose.  Pneumococcal polysaccharide (PPSV23) vaccine. When PCV13 is also indicated, PCV13 should be obtained first. All adults aged 58 years and older should be immunized. An adult younger than age 65 years who has certain medical conditions should be immunized. Any person who resides in a nursing home or long-term care facility should be immunized. An adult smoker should be immunized. People with an immunocompromised condition and certain other conditions should receive both PCV13 and PPSV23 vaccines. People with human immunodeficiency virus (HIV) infection should be immunized as soon as possible after diagnosis. Immunization during chemotherapy or radiation therapy should be avoided. Routine use of PPSV23 vaccine is not recommended for American Indians, [place] Natives, or people younger than 65 years unless there are medical conditions that require PPSV23 vaccine. When indicated,  people who have unknown immunization and have no record of immunization should receive PPSV23 vaccine. One-time revaccination 5 years after the first dose of PPSV23 is recommended for people aged 70 64 years who have chronic kidney failure, nephrotic syndrome, asplenia, or immunocompromised conditions. People who received 1 2 doses of PPSV23 before age 32 years should receive another dose of PPSV23 vaccine at age 96 years or later if at least 5 years have passed since the previous dose. Doses of PPSV23 are not needed for people immunized with PPSV23 at or after age 55 years.  Meningococcal vaccine. Adults with asplenia or persistent complement component deficiencies should receive 2 doses of quadrivalent meningococcal conjugate (MenACWY-D) vaccine. The doses should be obtained at least 2 months apart. Microbiologists working with certain meningococcal bacteria, [employer] recruits, people at risk during an outbreak, and people who travel to or live in countries with a high rate of meningitis should be immunized. A first-year college student up through age 58 years who is living in a residence hall should receive a dose if she did not receive a dose on or after her 16th birthday. Adults who have certain high-risk conditions should receive one or more doses of vaccine.  Hepatitis A vaccine. Adults who wish to be protected from this disease, have certain high-risk conditions, work with hepatitis A-infected animals, work in hepatitis A research labs, or travel to or work in countries with a high rate of hepatitis A should be  immunized. Adults who were previously unvaccinated and who anticipate close contact with an international adoptee during the first 60 days after arrival in the [place] from a country with a high rate of hepatitis A should be immunized.  Hepatitis B vaccine.  Adults who wish to be protected from this disease, have certain high-risk conditions, may be exposed to blood or other infectious  body fluids, are household contacts or sex partners of hepatitis B positive people, are clients or workers in certain care facilities, or travel to or work in countries with a high rate of hepatitis B should be immunized.  Haemophilus influenzae type b (Hib) vaccine. A previously unvaccinated person with asplenia or sickle cell disease or having a scheduled splenectomy should receive 1 dose of Hib vaccine. Regardless of previous immunization, a recipient of a hematopoietic stem cell transplant should receive a 3-dose series 6 12 months after her successful transplant. Hib vaccine is not recommended for adults with HIV infection.  Preventive Services / Frequency Ages 6 to 39years  Blood pressure check.** / Every 1 to 2 years.  Lipid and cholesterol check.** / Every 5 years beginning at age 39.  Clinical breast exam.** / Every 3 years for women in their 61s and 62s.  BRCA-related cancer risk assessment.** / For women who have family members with a BRCA-related cancer (breast, ovarian, tubal, or peritoneal cancers).  Pap test.** / Every 2 years from ages 47 through 85. Every 3 years starting at age 34 through age 12 or 74 with a history of 3 consecutive normal Pap tests.  HPV screening.** / Every 3 years from ages 46 through ages 43 to 54 with a history of 3 consecutive normal Pap tests.  Hepatitis C blood test.** / For any individual with known risks for hepatitis C.  Skin self-exam. / Monthly.  Influenza vaccine. / Every year.  Tetanus, diphtheria, and acellular pertussis (Tdap, Td) vaccine.** / Consult your health care provider. Pregnant women should receive 1 dose of Tdap vaccine during each pregnancy. 1 dose of Td every 10 years.  Varicella vaccine.** / Consult your health care provider. Pregnant females who do not have evidence of immunity should receive the first dose after pregnancy.  HPV vaccine. / 3 doses over 6 months, if 64 and younger. The vaccine is not recommended for use in  pregnant females. However, pregnancy testing is not needed before receiving a dose.  Measles, mumps, rubella (MMR) vaccine.** / You need at least 1 dose of MMR if you were born in 1957 or later. You may also need a 2nd dose. For females of childbearing age, rubella immunity should be determined. If there is no evidence of immunity, females who are not pregnant should be vaccinated. If there is no evidence of immunity, females who are pregnant should delay immunization until after pregnancy.  Pneumococcal 13-valent conjugate (PCV13) vaccine.** / Consult your health care provider.  Pneumococcal polysaccharide (PPSV23) vaccine.** / 1 to 2 doses if you smoke cigarettes or if you have certain conditions.  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 You may also need additional booster doses.  Hepatitis A vaccine.** / Consult your health care provider.  Hepatitis B vaccine.** / Consult your health care provider.  Haemophilus influenzae type b (Hib) vaccine.** / Consult your health care provider.  Ages 55 to 64years  Blood pressure check.** / Every 1 to 2 years.  Lipid and cholesterol check.** / Every 5 years beginning at age 20 years.  Lung cancer screening. / Every year if you are aged 55 80 years and have a 30-pack-year history of smoking and currently smoke or have quit within the past 15 years. Yearly screening is stopped once you have quit smoking for at least 15 years or develop a health problem that would prevent you from having lung cancer treatment.  Clinical breast exam.** / Every year after age 40 years.  BRCA-related cancer risk assessment.** / For women who have family members with a BRCA-related cancer (breast, ovarian, tubal, or peritoneal cancers).  Mammogram.** / Every year beginning at age 40  years and continuing for as long as you are in good health. Consult with your health care provider.  Pap test.** / Every 3 years starting at age 30 years through age 65 or 70 years with a history of 3 consecutive normal Pap tests.  HPV screening.** / Every 3 years from ages 30 years through ages 65 to 70 years with a history of 3 consecutive normal Pap tests.  Fecal occult blood test (FOBT) of stool. / Every year beginning at age 50 years and continuing until age 75 years. You may not need to do this test if you get a colonoscopy every 10 years.  Flexible sigmoidoscopy or colonoscopy.** / Every 5 years for a flexible sigmoidoscopy or every 10 years for a colonoscopy beginning at age 50 years and continuing until age 75 years.  Hepatitis C blood test.** / For all people born from 1945 through 1965 and any individual with known risks for hepatitis C.  Skin self-exam. / Monthly.  Influenza vaccine. / Every year.  Tetanus, diphtheria, and acellular pertussis (Tdap/Td) vaccine.** / Consult your health care provider. Pregnant women should receive 1 dose of Tdap vaccine during each pregnancy. 1 dose of Td every 10 years.  Varicella vaccine.** / Consult your health care provider. Pregnant females who do not have evidence of immunity should receive the first dose after pregnancy.  Zoster vaccine.** / 1 dose for adults aged 60 years or older.  Measles, mumps, rubella (MMR) vaccine.** / You need at least 1 dose of MMR if you were born in 1957 or later. You may also need a 2nd dose. For females of childbearing age, rubella immunity should be determined. If there is no evidence of immunity, females who are not pregnant should be vaccinated. If there is no evidence of immunity, females who are pregnant should delay immunization until after pregnancy.  Pneumococcal 13-valent conjugate (PCV13) vaccine.** / Consult your health care provider.  Pneumococcal polysaccharide (PPSV23) vaccine.** / 1 to 2 doses if  you smoke cigarettes or if you have certain conditions.  Meningococcal vaccine.** / Consult your health care provider.  Hepatitis A vaccine.** / Consult your health care provider.  Hepatitis B vaccine.** / Consult your health care provider.  Haemophilus influenzae type b (Hib) vaccine.** / Consult your health care provider.  Ages 65 years and over  Blood pressure check.** / Every 1 to 2 years.  Lipid and cholesterol check.** / Every 5 years beginning at age 20 years.  Lung cancer screening. / Every year if you are aged 55 80 years and have a 30-pack-year history of smoking and currently smoke or have quit within the past 15 years. Yearly screening is stopped once you have quit smoking for at least 15 years or develop a health problem that   would prevent you from having lung cancer treatment.  Clinical breast exam.** / Every year after age 103 years.  BRCA-related cancer risk assessment.** / For women who have family members with a BRCA-related cancer (breast, ovarian, tubal, or peritoneal cancers).  Mammogram.** / Every year beginning at age 36 years and continuing for as long as you are in good health. Consult with your health care provider.  Pap test.** / Every 3 years starting at age 5 years through age 85 or 10 years with 3 consecutive normal Pap tests. Testing can be stopped between 65 and 70 years with 3 consecutive normal Pap tests and no abnormal Pap or HPV tests in the past 10 years.  HPV screening.** / Every 3 years from ages 93 years through ages 70 or 45 years with a history of 3 consecutive normal Pap tests. Testing can be stopped between 65 and 70 years with 3 consecutive normal Pap tests and no abnormal Pap or HPV tests in the past 10 years.  Fecal occult blood test (FOBT) of stool. / Every year beginning at age 8 years and continuing until age 45 years. You may not need to do this test if you get a colonoscopy every 10 years.  Flexible sigmoidoscopy or colonoscopy.** /  Every 5 years for a flexible sigmoidoscopy or every 10 years for a colonoscopy beginning at age 69 years and continuing until age 68 years.  Hepatitis C blood test.** / For all people born from [birth year] through 1966 and any individual with known risks for hepatitis C.  Osteoporosis screening.** / A one-time screening for women ages 7 years and over and women at risk for fractures or osteoporosis.  Skin self-exam. / Monthly.  Influenza vaccine. / Every year.  Tetanus, diphtheria, and acellular pertussis (Tdap/Td) vaccine.** / 1 dose of Td every 10 years.  Varicella vaccine.** / Consult your health care provider.  Zoster vaccine.** / 1 dose for adults aged 5 years or older.  Pneumococcal 13-valent conjugate (PCV13) vaccine.** / Consult your health care provider.  Pneumococcal polysaccharide (PPSV23) vaccine.** / 1 dose for all adults aged 74 years and older.  Meningococcal vaccine.** / Consult your health care provider.  Hepatitis A vaccine.** / Consult your health care provider.  Hepatitis B vaccine.** / Consult your health care provider.  Haemophilus influenzae type b (Hib) vaccine.** / Consult your health care provider. ** Family history and personal history of risk and conditions may change your health care provider's recommendations. Document Released: 02/11/2002 Document Revised: 10/06/2013  [facility] Patient Information 2014 [place], [place].   EXERCISE AND DIET:  We recommended that you start or continue a regular exercise program for good health. Regular exercise means any activity that makes your heart beat faster and makes you sweat.  We recommend exercising at least 30 minutes per day at least 3 days a week, preferably 5.  We also recommend a diet low in fat and sugar / carbohydrates.  Inactivity, poor dietary choices and obesity can cause diabetes, heart attack, stroke, and kidney damage, among others.     ALCOHOL AND SMOKING:  Women should limit their alcohol intake to no  more than 7 drinks/beers/glasses of wine (combined, not each!) per week. Moderation of alcohol intake to this level decreases your risk of breast cancer and liver damage.  ( And of course, no recreational drugs are part of a healthy lifestyle.)  Also, you should not be smoking at all or even being exposed to second hand smoke. Most people know smoking can  cause cancer, and various heart and lung diseases, but did you know it also contributes to weakening of your bones?  Aging of your skin?  Yellowing of your teeth and nails?   CALCIUM AND VITAMIN D:  Adequate intake of calcium and Vitamin D are recommended.  The recommendations for exact amounts of these supplements seem to change often, but generally speaking 600 mg of calcium (either carbonate or citrate) and 800 units of Vitamin D per day seems prudent. Certain women may benefit from higher intake of Vitamin D.  If you are among these women, your doctor will have told you during your visit.     PAP SMEARS:  Pap smears, to check for cervical cancer or precancers,  have traditionally been done yearly, although recent scientific advances have shown that most women can have pap smears less often.  However, every woman still should have a physical exam from her gynecologist or primary care physician every year. It will include a breast check, inspection of the vulva and vagina to check for abnormal growths or skin changes, a visual exam of the cervix, and then an exam to evaluate the size and shape of the uterus and ovaries.  And after 49 years of age, a rectal exam is indicated to check for rectal cancers. We will also provide age appropriate advice regarding health maintenance, like when you should have certain vaccines, screening for sexually transmitted diseases, bone density testing, colonoscopy, mammograms, etc.    MAMMOGRAMS:  All women over 39 years old should have a yearly mammogram. Many facilities now offer a "3D" mammogram, which may cost  around $50 extra out of pocket. If possible,  we recommend you accept the option to have the 3D mammogram performed.  It both reduces the number of women who will be called back for extra views which then turn out to be normal, and it is better than the routine mammogram at detecting truly abnormal areas.     COLONOSCOPY:  Colonoscopy to screen for colon cancer is recommended for all women at age 30.  We know, you hate the idea of the prep.  We agree, BUT, having colon cancer and not knowing it is worse!!  Colon cancer so often starts as a polyp that can be seen and removed at colonscopy, which can quite literally save your life!  And if your first colonoscopy is normal and you have no family history of colon cancer, most women don't have to have it again for 10 years.  Once every ten years, you can do something that may end up saving your life, right?  We will be happy to help you get it scheduled when you are ready.  Be sure to check your insurance coverage so you understand how much it will cost.  It may be covered as a preventative service at no cost, but you should check your particular policy.    Increase plain water intake, strive to drink at least half of your body weight in ounces per day. Decrease wine intake, per safe drinking guidelines 1 drink per day max for women. Follow DASH diet. Increase daily walking. Please check blood pressure and heart rate daily- bring log to follow-up in 2weeks. Try to reduce to stop tobacco use- you can do it! Referral placed to Gastroenterologist. Follow-up two weeks. GREAT TO SEE YOU!

## 2019-10-27 NOTE — Assessment & Plan Note (Signed)
Encouraged to stop tobacco and dramatically reduce ETOH use (max one drink/day). Omeprazole 20mg  QD, 30 count with one refill Referral to GI placed

## 2019-10-27 NOTE — Assessment & Plan Note (Signed)
CBC-slight elevation in H/H- likely r/t to tobacco use  Has had elevated MCH and MCH levels in past-denies known hx of family blood disorders

## 2019-10-27 NOTE — Assessment & Plan Note (Signed)
1/2 pack per pday Declined tobacco cessation

## 2019-10-27 NOTE — Assessment & Plan Note (Signed)
Increase plain water intake, strive to drink at least half of your body weight in ounces per day. Decrease wine intake, per safe drinking guidelines 1 drink per day max for women. Follow DASH diet. Increase daily walking. Please check blood pressure and heart rate daily- bring log to follow-up in 2weeks. Try to reduce to stop tobacco use- you can do it! Referral placed to Gastroenterologist. Follow-up two weeks.

## 2019-10-28 ENCOUNTER — Encounter: Payer: Self-pay | Admitting: Gastroenterology

## 2019-11-10 NOTE — Progress Notes (Signed)
Subjective:    Patient ID: Allison Wallace, female    DOB: 09/11/1971, 48 y.o.   MRN: [id number]  HPI:10/27/2019 OV: Allison Wallace is here for CPE She has been working >50 hrs/week She reports limited time to exercise, therefore has not. She continues to smoke 1/2 pack per day- declined smoking cessation today She continues to drink 3-4 glasses of wine per day. She reports a diet high in saturated fat/Na+  10/22/2019 Labs: TSH-1.370 A1c-5.1  CMP-stable  CBC-slight elevation in H/H- likely r/t to tobacco use  Has had elevated MCH and MCH levels in past-denies known hx of family blood disorders  The 10-year ASCVD risk score Mikey Bussing DC Jr., et al., 2013) is: 3.6%  Values used to calculate the score:   Age: 63 years   Sex: Female   Is Non-Hispanic African American: No   Diabetic: No   Tobacco smoker: Yes   Systolic Blood Pressure: [id number] mmHg   Is BP treated: No   HDL Cholesterol: 51 mg/dL   Total Cholesterol: 191 mg/dL  LDL-120  06/2018 LDL-116 Lifestyle encouraged  Father had HLD  11/11/2019 OV: Allison Wallace is here for 2 week f/u: elevated BP She reports ambulatory BP readings SBP upper 130s- mid 140s DBP 90 HR 90-100 She continues to smoke half pack/day, still not ready to quit, declined smoking cessation She continues to drink 3-4 glasses wine/night She has increased daily walking 20-60mins/day- great! She has been trying to follow a DASH diet  10/22/2019 CMP- stable  Patient Care Team    Relationship Specialty Notifications Start End  Mina Marble D, NP PCP - General Family Medicine  05/13/17   Allyn Kenner, MD  Dermatology  05/13/17   Olga Millers, MD Consulting Physician Obstetrics and Gynecology  05/13/17     Patient Active Problem List   Diagnosis Date Noted  . HTN, goal below 130/80 11/11/2019  . GERD (gastroesophageal reflux disease) 10/27/2019  . Urinary frequency 12/14/2018  . Flank pain 09/09/2018  . Spasm of thoracic  back muscle 09/09/2018  . Elevated LDL cholesterol level 07/06/2018  . Abnormal CBC 07/06/2018  . 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 Tremor of unknown origin 07/06/2018  . Family history of high cholesterol 05/13/2017  . Fatigue 05/13/2017  . Healthcare maintenance 05/13/2017  . Tobacco use disorder 05/13/2017     History reviewed. No pertinent past medical history.   History reviewed. No pertinent surgical history.   Family History  Problem Relation Age of Onset  . Hyperlipidemia Father      Social History   Substance and Sexual Activity  Drug Use No     Social History   Substance and Sexual Activity  Alcohol Use Yes  . Alcohol/week: 4.0 standard drinks  . Types: 4 Glasses of wine per week     Social History   Tobacco Use  Smoking Status Current Every Day Smoker  . Types: Cigarettes  Smokeless Tobacco Current User  Tobacco Comment   1 pack every 2 days     Outpatient Encounter Medications as of 11/11/2019  Medication Sig  . cyclobenzaprine (FLEXERIL) 10 MG tablet Take 1 tablet (10 mg total) by mouth at bedtime as needed for muscle spasms.  . Ivermectin (SOOLANTRA) 1 % CREA Apply topically.  . Multiple Vitamin (ONE-A-DAY ADULT VITACRAVES+DHA) CHEW Chew by mouth.  Marland Kitchen omeprazole (PRILOSEC) 20 MG capsule Take 1 capsule (20 mg total) by mouth daily.  Marland Kitchen lisinopril (ZESTRIL) 5 MG tablet Take 1 tablet (5 mg total) by  mouth daily.   No facility-administered encounter medications on file as of 11/11/2019.     Allergies: Patient has no known allergies.  Body mass index is 29.03 kg/m.  Blood pressure (!) 145/92, pulse 100, temperature 98.8 F (37.1 C), temperature source Oral, height 5' 7.5" (1.715 m), weight 188 lb 1.6 oz (85.3 kg), last menstrual period 10/28/2019, SpO2 98 %.  Review of Systems  Constitutional: Positive for fatigue. Negative for activity change, appetite change, chills, diaphoresis, fever and unexpected weight change.  Respiratory: Negative  for cough and chest tightness.   Cardiovascular: Negative for chest pain, palpitations and leg swelling.  Neurological: Positive for headaches. Negative for dizziness.  Hematological: Negative for adenopathy. Does not bruise/bleed easily.  Psychiatric/Behavioral: The patient is nervous/anxious.        Objective:   Physical Exam Vitals signs and nursing note reviewed.  Constitutional:      General: She is not in acute distress.    Appearance: Normal appearance. She is not ill-appearing, toxic-appearing or diaphoretic.     Comments: Ruddy face  Cardiovascular:     Rate and Rhythm: Normal rate and regular rhythm.     Pulses: Normal pulses.     Heart sounds: Normal heart sounds. No murmur. No friction rub. No gallop.   Skin:    General: Skin is warm and dry.     Capillary Refill: Capillary refill takes less than 2 seconds.  Neurological:     Mental Status: She is alert and oriented to person, place, and time.  Psychiatric:        Attention and Perception: Attention and perception normal.        Mood and Affect: Mood is anxious.        Speech: Speech normal.        Behavior: Behavior normal.        Thought Content: Thought content normal.        Cognition and Memory: Cognition and memory normal.        Judgment: Judgment normal.       Assessment & Plan:   1. HTN, goal below 130/80   2. Tobacco use disorder     HTN, goal below 130/80  Increase water intake, follow DASH diet, continue daily walking. Start once daily Lisinopril 5mg . Check blood pressure and heart rate once daily- record. Reduce to stop tobacco use. Reduce nightly wine use- try to keep to one/max. Follow-up 2 weeks TeleMedicine.  Tobacco use disorder 1/2 pack per day Declined smoking cessation today    FOLLOW-UP:  Return in about 2 weeks (around 11/25/2019) for Regular Follow Up, HTN, Evaluate Medication Effectiveness, TeleMedicine Follow-Up.

## 2019-11-11 ENCOUNTER — Other Ambulatory Visit: Payer: Self-pay

## 2019-11-11 ENCOUNTER — Ambulatory Visit (INDEPENDENT_AMBULATORY_CARE_PROVIDER_SITE_OTHER): Payer: Managed Care, Other (non HMO) | Admitting: Adult Health

## 2019-11-11 ENCOUNTER — Encounter: Payer: Self-pay | Admitting: Adult Health

## 2019-11-11 DIAGNOSIS — I10 Essential (primary) hypertension: Secondary | ICD-10-CM | POA: Diagnosis not present

## 2019-11-11 DIAGNOSIS — F17200 Nicotine dependence, unspecified, uncomplicated: Secondary | ICD-10-CM

## 2019-11-11 MED ORDER — LISINOPRIL 5 MG PO TABS: 5.0000 mg | ORAL_TABLET | Freq: Every day | ORAL | 0 refills | Status: DC

## 2019-11-11 NOTE — Assessment & Plan Note (Signed)
1/2 pack per day Declined smoking cessation today

## 2019-11-11 NOTE — Patient Instructions (Addendum)
Managing Your Hypertension Hypertension is commonly called high blood pressure. This is when the force of your blood pressing against the walls of your arteries is too strong. Arteries are blood vessels that carry blood from your heart throughout your body. Hypertension forces the heart to work harder to pump blood, and may cause the arteries to become narrow or stiff. Having untreated or uncontrolled hypertension can cause heart attack, stroke, kidney disease, and other problems. What are blood pressure readings? A blood pressure reading consists of a higher number over a lower number. Ideally, your blood pressure should be below 120/80. The first ("top") number is called the systolic pressure. It is a measure of the pressure in your arteries as your heart beats. The second ("bottom") number is called the diastolic pressure. It is a measure of the pressure in your arteries as the heart relaxes. What does my blood pressure reading mean? Blood pressure is classified into four stages. Based on your blood pressure reading, your health care provider may use the following stages to determine what type of treatment you need, if any. Systolic pressure and diastolic pressure are measured in a unit called mm Hg. Normal  Systolic pressure: below 784.  Diastolic pressure: below 80. Elevated  Systolic pressure: 696-295.  Diastolic pressure: below 80. Hypertension stage 1  Systolic pressure: 284-132.  Diastolic pressure: 44-01. Hypertension stage 2  Systolic pressure: 027 or above.  Diastolic pressure: 90 or above. What health risks are associated with hypertension? Managing your hypertension is an important responsibility. Uncontrolled hypertension can lead to:  A heart attack.  A stroke.  A weakened blood vessel (aneurysm).  Heart failure.  Kidney damage.  Eye damage.  Metabolic syndrome.  Memory and concentration problems. What changes can I make to manage my hypertension?  Hypertension can be managed by making lifestyle changes and possibly by taking medicines. Your health care provider will help you make a plan to bring your blood pressure within a normal range. Eating and drinking   Eat a diet that is high in fiber and potassium, and low in salt (sodium), added sugar, and fat. An example eating plan is called the DASH (Dietary Approaches to Stop Hypertension) diet. To eat this way: ? Eat plenty of fresh fruits and vegetables. Try to fill half of your plate at each meal with fruits and vegetables. ? Eat whole grains, such as whole wheat pasta, brown rice, or whole grain bread. Fill about one quarter of your plate with whole grains. ? Eat low-fat diary products. ? Avoid fatty cuts of meat, processed or cured meats, and poultry with skin. Fill about one quarter of your plate with lean proteins such as fish, chicken without skin, beans, eggs, and tofu. ? Avoid premade and processed foods. These tend to be higher in sodium, added sugar, and fat.  Reduce your daily sodium intake. Most people with hypertension should eat less than 1,500 mg of sodium a day.  Limit alcohol intake to no more than 1 drink a day for nonpregnant women and 2 drinks a day for men. One drink equals 12 oz of beer, 5 oz of wine, or 1 oz of hard liquor. Lifestyle  Work with your health care provider to maintain a healthy body weight, or to lose weight. Ask what an ideal weight is for you.  Get at least 30 minutes of exercise that causes your heart to beat faster (aerobic exercise) most days of the week. Activities may include walking, swimming, or biking.  Include exercise  to strengthen your muscles (resistance exercise), such as weight lifting, as part of your weekly exercise routine. Try to do these types of exercises for 30 minutes at least 3 days a week.  Do not use any products that contain nicotine or tobacco, such as cigarettes and e-cigarettes. If you need help quitting, ask your health  care provider.  Control any long-term (chronic) conditions you have, such as high cholesterol or diabetes. Monitoring  Monitor your blood pressure at home as told by your health care provider. Your personal target blood pressure may vary depending on your medical conditions, your age, and other factors.  Have your blood pressure checked regularly, as often as told by your health care provider. Working with your health care provider  Review all the medicines you take with your health care provider because there may be side effects or interactions.  Talk with your health care provider about your diet, exercise habits, and other lifestyle factors that may be contributing to hypertension.  Visit your health care provider regularly. Your health care provider can help you create and adjust your plan for managing hypertension. Will I need medicine to control my blood pressure? Your health care provider may prescribe medicine if lifestyle changes are not enough to get your blood pressure under control, and if:  Your systolic blood pressure is 130 or higher.  Your diastolic blood pressure is 80 or higher. Take medicines only as told by your health care provider. Follow the directions carefully. Blood pressure medicines must be taken as prescribed. The medicine does not work as well when you skip doses. Skipping doses also puts you at risk for problems. Contact a health care provider if:  You think you are having a reaction to medicines you have taken.  You have repeated (recurrent) headaches.  You feel dizzy.  You have swelling in your ankles.  You have trouble with your vision. Get help right away if:  You develop a severe headache or confusion.  You have unusual weakness or numbness, or you feel faint.  You have severe pain in your chest or abdomen.  You vomit repeatedly.  You have trouble breathing. Summary  Hypertension is when the force of blood pumping through your arteries  is too strong. If this condition is not controlled, it may put you at risk for serious complications.  Your personal target blood pressure may vary depending on your medical conditions, your age, and other factors. For most people, a normal blood pressure is less than 120/80.  Hypertension is managed by lifestyle changes, medicines, or both. Lifestyle changes include weight loss, eating a healthy, low-sodium diet, exercising more, and limiting alcohol. This information is not intended to replace advice given to you by your health care provider. Make sure you discuss any questions you have with your health care provider. Document Released: 09/09/2012 Document Revised: 04/09/2019 Document Reviewed: 11/13/2016 Elsevier Patient Education  [place].  Increase water intake, follow DASH diet, continue daily walking. Start once daily Lisinopril 5mg . Check blood pressure and heart rate once daily- record. Reduce to stop tobacco use. Reduce nightly wine use- try to keep to one/max. Follow-up 2 weeks TeleMedicine. Continue to social distance and wear a mask when in public.

## 2019-11-11 NOTE — Assessment & Plan Note (Signed)
  Increase water intake, follow DASH diet, continue daily walking. Start once daily Lisinopril 5mg . Check blood pressure and heart rate once daily- record. Reduce to stop tobacco use. Reduce nightly wine use- try to keep to one/max. Follow-up 2 weeks TeleMedicine.

## 2019-11-23 NOTE — Progress Notes (Signed)
Virtual Visit via Telephone Note  I connected with Allison Wallace on 11/24/2019 at  3:15 PM EST by telephone and verified that I am speaking with the correct person using two identifiers.  Location: Patient:Home Provider:In Clinic   I discussed the limitations, risks, security and privacy concerns of performing an evaluation and management service by telephone and the availability of in person appointments. I also discussed with the patient that there may be a patient responsible charge related to this service. The patient expressed understanding and agreed to proceed.   History of Present Illness: 11/11/2019 OV: [name] is here for 2 week f/u: elevated BP She reports ambulatory BP readings SBP upper 130s- mid 140s DBP 90 HR 90-100 She continues to smoke half pack/day, still not ready to quit, declined smoking cessation She continues to drink 3-4 glasses wine/night She has increased daily walking 20-61mins/day- great! She has been trying to follow a DASH diet 10/22/2019 CMP- stable  11/24/2019 OV: Allison Wallace calls in for 2 weeks f/u: HTN- She was started on once daily Lisinopril 5mg . Encouraged to increase water intake, follow DASH diet, continue daily walking. Check blood pressure and heart rate once daily- record. Reduce to stop tobacco use. Reduce nightly wine use- try to keep to one/max. Ambulatory BP SBP 120s DBP 80s HR ?- not checking it She denies acute cardiac sx's  She denies HA/dizziness She is walking 25-30 mins 6 days/week. She has been reducing saturated fat. She has reduced wine intake from 4 to 3 glasses- encouraged to keep reducing goal- 1 per night. She continues to smoke /12 pack per day- again declined smoking cessation. Overall she is feeling "a bit better".  Patient Care Team    Relationship Specialty Notifications Start End  Mina Marble D, NP PCP - General Family Medicine  05/13/17   Allyn Kenner, MD  Dermatology  05/13/17   Olga Millers, MD Consulting Physician Obstetrics and Gynecology  05/13/17     Patient Active Problem List   Diagnosis Date Noted  . HTN, goal below 130/80 11/11/2019  . GERD (gastroesophageal reflux disease) 10/27/2019  . Urinary frequency 12/14/2018  . Flank pain 09/09/2018  . Spasm of thoracic back muscle 09/09/2018  . Elevated LDL cholesterol level 07/06/2018  . Abnormal CBC 07/06/2018  . 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 Tremor of unknown origin 07/06/2018  . Family history of high cholesterol 05/13/2017  . Fatigue 05/13/2017  . Healthcare maintenance 05/13/2017  . Tobacco use disorder 05/13/2017     Past Medical History:  Diagnosis Date  . Elevated cholesterol   . GERD (gastroesophageal reflux disease)   . Tobacco use   . Tremor of unknown origin      History reviewed. No pertinent surgical history.   Family History  Problem Relation Age of Onset  . Hyperlipidemia Father      Social History   Substance and Sexual Activity  Drug Use No     Social History   Substance and Sexual Activity  Alcohol Use Yes  . Alcohol/week: 4.0 standard drinks  . Types: 4 Glasses of wine per week     Social History   Tobacco Use  Smoking Status Current Every Day Smoker  . Types: Cigarettes  Smokeless Tobacco Current User  Tobacco Comment   1 pack every 2 days     Outpatient Encounter Medications as of 11/24/2019  Medication Sig  . cyclobenzaprine (FLEXERIL) 10 MG tablet Take 1 tablet (10 mg total) by mouth at bedtime as  needed for muscle spasms.  . Ivermectin (SOOLANTRA) 1 % CREA Apply topically.  Marland Kitchen lisinopril (ZESTRIL) 5 MG tablet Take 1 tablet (5 mg total) by mouth daily.  . Multiple Vitamin (ONE-A-DAY ADULT VITACRAVES+DHA) CHEW Chew by mouth.  Marland Kitchen omeprazole (PRILOSEC) 20 MG capsule Take 1 capsule (20 mg total) by mouth daily.   No facility-administered encounter medications on file as of 11/24/2019.     Allergies: Patient has no known allergies.  There is no height or  weight on file to calculate BMI.  Blood pressure 129/86, temperature 98.1 F (36.7 C), temperature source Oral, last menstrual period 10/28/2019. Review of Systems: General:   Denies fever, chills, unexplained weight loss.  Optho/Auditory:   Denies visual changes, blurred vision/LOV Respiratory:   Denies SOB, DOE more than baseline levels.  Cardiovascular:   Denies chest pain, palpitations, new onset peripheral edema  Gastrointestinal:   Denies nausea, vomiting, diarrhea.  Genitourinary: Denies dysuria, freq/ urgency, flank pain or discharge from genitals.  Endocrine:     Denies hot or cold intolerance, polyuria, polydipsia. Musculoskeletal:   Denies unexplained myalgias, joint swelling, unexplained arthralgias, gait problems.  Skin:  Denies rash, suspicious lesions Neurological:     Denies dizziness, unexplained weakness, numbness  Psychiatric/Behavioral:   Denies mood changes, suicidal or homicidal ideations, hallucinations  Observations/Objective: No acute distress noted during the telephone conversation.  Assessment and Plan: Ambulatory BP at goal Continue Lisinopril 5mg  QD Continue to reduce wine intake- strive for one glass max per night. Continue to reduce to stop tobacco use- you can do it! Continue to walk daily Continue to social distance and wear a mask when in public.  Follow Up Instructions: 3 months    I discussed the assessment and treatment plan with the patient. The patient was provided an opportunity to ask questions and all were answered. The patient agreed with the plan and demonstrated an understanding of the instructions.   The patient was advised to call back or seek an in-person evaluation if the symptoms worsen or if the condition fails to improve as anticipated.  I provided 8 minutes of non-face-to-face time during this encounter.   Allison Grandchild, NP

## 2019-11-24 ENCOUNTER — Encounter: Payer: Self-pay | Admitting: Adult Health

## 2019-11-24 ENCOUNTER — Other Ambulatory Visit: Payer: Self-pay

## 2019-11-24 ENCOUNTER — Ambulatory Visit (INDEPENDENT_AMBULATORY_CARE_PROVIDER_SITE_OTHER): Payer: Managed Care, Other (non HMO) | Admitting: Adult Health

## 2019-11-24 DIAGNOSIS — I10 Essential (primary) hypertension: Secondary | ICD-10-CM

## 2019-11-24 NOTE — Assessment & Plan Note (Signed)
  Assessment and Plan: Ambulatory BP at goal Continue Lisinopril 5mg  QD Continue to reduce wine intake- strive for one glass max per night. Continue to reduce to stop tobacco use- you can do it! Continue to walk daily Continue to social distance and wear a mask when in public.  Follow Up Instructions: 3 months    I discussed the assessment and treatment plan with the patient. The patient was provided an opportunity to ask questions and all were answered. The patient agreed with the plan and demonstrated an understanding of the instructions.   The patient was advised to call back or seek an in-person evaluation if the symptoms worsen or if the condition fails to improve as anticipated.

## 2019-12-01 ENCOUNTER — Other Ambulatory Visit: Payer: Self-pay | Admitting: Adult Health

## 2019-12-01 ENCOUNTER — Ambulatory Visit: Payer: Managed Care, Other (non HMO) | Admitting: Gastroenterology

## 2020-02-09 ENCOUNTER — Other Ambulatory Visit: Payer: Self-pay | Admitting: Adult Health

## 2020-03-05 ENCOUNTER — Other Ambulatory Visit: Payer: Self-pay | Admitting: Adult Health

## 2020-04-02 ENCOUNTER — Other Ambulatory Visit: Payer: Self-pay | Admitting: Family Medicine

## 2020-04-03 ENCOUNTER — Telehealth: Payer: Self-pay

## 2020-04-03 ENCOUNTER — Telehealth: Payer: Self-pay | Admitting: Adult Health

## 2020-04-03 ENCOUNTER — Other Ambulatory Visit: Payer: Self-pay | Admitting: Adult Health

## 2020-04-03 MED ORDER — OMEPRAZOLE 20 MG PO CPDR: DELAYED_RELEASE_CAPSULE | ORAL | 0 refills | Status: DC

## 2020-04-03 NOTE — Telephone Encounter (Signed)
Please call pt to schedule appt.  No further refills until pt is seen.  T. Ellarae Nevitt, CMA  

## 2020-04-03 NOTE — Telephone Encounter (Signed)
Please call pt to schedule appt.  No further refills until pt is seen.  T. Kelten Enochs, CMA  

## 2020-04-03 NOTE — Addendum Note (Signed)
Addended by: Fonnie Mu on: 04/03/2020 05:17 PM   Modules accepted: Orders

## 2020-04-03 NOTE — Telephone Encounter (Signed)
Patient is requesting a refill of her omeprazole, if approved please send to [facility] on [address]

## 2020-04-16 ENCOUNTER — Other Ambulatory Visit: Payer: Self-pay | Admitting: Family Medicine

## 2020-04-27 ENCOUNTER — Telehealth: Payer: Self-pay | Admitting: Physician Assistant

## 2020-04-27 NOTE — Telephone Encounter (Signed)
Patient called 4:50 seeking appt for upper torso/  Pain under upper rib & hurting for a couple of weeks --advised office closing@ 5pm, chk'd for availability for Friday 4/30 no appt (unless cancellations ) placed her on list & also advised medical staff in room w/ patient as it is end of the day--Pt understands advised her to seek Immediate Medical Attention if pain persist & do not delay waiting on an Appt w/ provider--Patient agreeable   --Also forwarding message to med asst that if poss before closing to contact pt but advised pt it maybe tomorrow before contacted (pls go to [facility] UC or ED for treatment/ care.  Pt's ph# [phone]  -glh

## 2020-04-28 NOTE — Telephone Encounter (Signed)
Patient should have an evaluation of intermittent RUQ pain if pain persists, worsens or new symptoms start developing such as chest pain, L arm/neck pain, fever, epigastric pain, N/V/D. RUQ pain can be due to various reasons- biliary colic, acute cholecystitis etc. She can try OTC simethicone if it could be related to flatulence or famotidine (Pepcid) for possible acid reflux. Thank you.

## 2020-04-28 NOTE — Telephone Encounter (Signed)
Left message for patient to call back. AS, CMA 

## 2020-04-28 NOTE — Telephone Encounter (Signed)
Called patient and she advises that she is 'not feeling bad today' but that for the last 2+ weeks she has had 'waves of pain' in her R upper quadrant. No chest pain, L arm pain, neck pain, dizziness, nausea, vomiting, diarrhea.  Patient did not go to UC for evaluation.   Please advise.   AS, CMA

## 2020-04-28 NOTE — Telephone Encounter (Signed)
Patient is aware of the below and verbalized understanding. AS, CMA 

## 2020-05-01 ENCOUNTER — Ambulatory Visit
Admission: EM | Admit: 2020-05-01 | Discharge: 2020-05-01 | Disposition: A | Discharge status: Home / Self Care | Payer: Managed Care, Other (non HMO) | Attending: Emergency Medicine | Admitting: Emergency Medicine

## 2020-05-01 DIAGNOSIS — R1011 Right upper quadrant pain: Secondary | ICD-10-CM

## 2020-05-01 HISTORY — DX: Essential (primary) hypertension: I10

## 2020-05-01 LAB — CBC WITH DIFFERENTIAL/PLATELET
Basophils Absolute: 0 10*3/uL (ref 0.0–0.2)
Basos: 0 %
EOS (ABSOLUTE): 0.1 10*3/uL (ref 0.0–0.4)
Eos: 1 %
Hematocrit: 48.5 % — ABNORMAL HIGH (ref 34.0–46.6)
Hemoglobin: 16.5 g/dL — ABNORMAL HIGH (ref 11.1–15.9)
Lymphocytes Absolute: 1.7 10*3/uL (ref 0.7–3.1)
Lymphs: 21 %
MCH: 36.6 pg — ABNORMAL HIGH (ref 26.6–33.0)
MCHC: 34 g/dL (ref 31.5–35.7)
MCV: 108 fL — ABNORMAL HIGH (ref 79–97)
Monocytes Absolute: 0.8 10*3/uL (ref 0.1–0.9)
Monocytes: 10 %
Neutrophils Absolute: 5.4 10*3/uL (ref 1.4–7.0)
Neutrophils: 68 %
Platelets: 267 10*3/uL (ref 150–450)
RBC: 4.51 x10E6/uL (ref 3.77–5.28)
RDW: 13 % (ref 11.7–15.4)
WBC: 7.9 10*3/uL (ref 3.4–10.8)

## 2020-05-01 LAB — COMPREHENSIVE METABOLIC PANEL
ALT: 22 IU/L (ref 0–32)
AST: 20 IU/L (ref 0–40)
Albumin/Globulin Ratio: 2 (ref 1.2–2.2)
Albumin: 4.4 g/dL (ref 3.8–4.8)
Alkaline Phosphatase: 106 IU/L (ref 39–117)
BUN/Creatinine Ratio: 14 (ref 9–23)
BUN: 10 mg/dL (ref 6–24)
Bilirubin Total: 0.6 mg/dL (ref 0.0–1.2)
CO2: 21 mmol/L (ref 20–29)
Calcium: 9.3 mg/dL (ref 8.7–10.2)
Chloride: 104 mmol/L (ref 96–106)
Creatinine, Ser: 0.69 mg/dL (ref 0.57–1.00)
GFR calc Af Amer: 118 mL/min/{1.73_m2} (ref >59)
GFR calc non Af Amer: 103 mL/min/{1.73_m2} (ref >59)
Globulin, Total: 2.2 g/dL (ref 1.5–4.5)
Glucose: 98 mg/dL (ref 65–99)
Potassium: 4.5 mmol/L (ref 3.5–5.2)
Sodium: 137 mmol/L (ref 134–144)
Total Protein: 6.6 g/dL (ref 6.0–8.5)

## 2020-05-01 LAB — AMYLASE: Amylase: 63 U/L (ref 31–110)

## 2020-05-01 LAB — LIPASE: Lipase: 22 U/L (ref 14–72)

## 2020-05-01 MED ORDER — NAPROXEN 500 MG PO TABS
500.0000 mg | ORAL_TABLET | Freq: Two times a day (BID) | ORAL | 0 refills | Status: DC
Start: 2020-05-01 — End: 2021-02-16

## 2020-05-01 NOTE — Discharge Instructions (Signed)
Important to keep log of symptoms to review with your primary care this Thursday. Blood work pending: We will call you with the results later this evening. Go to ER for worsening pain, fever, vomiting, blood in stool.

## 2020-05-01 NOTE — ED Provider Notes (Signed)
[facility]    CSN: [id number] Arrival date & time: 05/01/20  1108      History   Chief Complaint Chief Complaint  Patient presents with  . Abdominal Pain    HPI Allison Wallace is a 49 y.o. female with history of tobacco use, hypertension presenting for intermittent right UQ pain x3 weeks.  States she has had 3 episodes of sharp pain though has a near constant dull ache.  Denies history of abdominal surgeries, cholelithiasis, hepatic disease.  Has not tried anything for this.  Denies known injury, cough, recent illness, fever.  Does consume 3-4 alcoholic with beverages daily: No change thereof.  Past Medical History:  Diagnosis Date  . Elevated cholesterol   . GERD (gastroesophageal reflux disease)   . Hypertension   . Tobacco use   . Tremor of unknown origin     Patient Active Problem List   Diagnosis Date Noted  . HTN, goal below 130/80 11/11/2019  . GERD (gastroesophageal reflux disease) 10/27/2019  . Urinary frequency 12/14/2018  . Flank pain 09/09/2018  . Spasm of thoracic back muscle 09/09/2018  . Elevated LDL cholesterol level 07/06/2018  . Abnormal CBC 07/06/2018  . 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 Tremor of unknown origin 07/06/2018  . Family history of high cholesterol 05/13/2017  . Fatigue 05/13/2017  . Healthcare maintenance 05/13/2017  . Tobacco use disorder 05/13/2017    History reviewed. No pertinent surgical history.  OB History   No obstetric history on file.      Home Medications    Prior to Admission medications   Medication Sig Start Date End Date Taking? Authorizing Provider  Ivermectin (SOOLANTRA) 1 % CREA Apply topically.    [provider]  lisinopril (ZESTRIL) 5 MG tablet TAKE 1 TABLET (5 MG TOTAL) BY MOUTH DAILY. **PATIENT NEEDS APT FOR FURTHER REFILLS** 04/17/20   Lorrene Reid, PA-C  Multiple Vitamin (ONE-A-DAY ADULT VITACRAVES+DHA) CHEW Chew by mouth.    [provider]  naproxen (NAPROSYN) 500  MG tablet Take 1 tablet (500 mg total) by mouth 2 (two) times daily. 05/01/20   Hall-Potvin, [place], PA-C  omeprazole (PRILOSEC) 20 MG capsule TAKE 1 CAPSULE BY MOUTH EVERY DAY 04/03/20   Mellody Dance, DO    Family History Family History  Problem Relation Age of Onset  . Hyperlipidemia Father     Social History Social History   Tobacco Use  . Smoking status: Current Every Day Smoker    Types: Cigarettes  . Smokeless tobacco: Current User  . Tobacco comment: 1 pack every 2 days  Substance Use Topics  . Alcohol use: Yes    Alcohol/week: 4.0 standard drinks    Types: 4 Glasses of wine per week  . Drug use: No     Allergies   Patient has no known allergies.   Review of Systems As per HPI   Physical Exam Triage Vital Signs ED Triage Vitals  Enc Vitals Group     BP      Pulse      Resp      Temp      Temp src      SpO2      Weight      Height      Head Circumference      Peak Flow      Pain Score      Pain Loc      Pain Edu?      Excl. in [place]?  No data found.  Updated Vital Signs BP (!) 167/108 (BP Location: Left Arm)   Pulse 85   Temp 99.1 F (37.3 C) (Oral)   Resp 16   LMP 04/19/2020   SpO2 98%   Visual Acuity Right Eye Distance:   Left Eye Distance:   Bilateral Distance:    Right Eye Near:   Left Eye Near:    Bilateral Near:     Physical Exam Constitutional:      General: She is not in acute distress.    Appearance: She is obese. She is not ill-appearing.  HENT:     Head: Normocephalic and atraumatic.  Eyes:     General: No scleral icterus.    Pupils: Pupils are equal, round, and reactive to light.  Cardiovascular:     Rate and Rhythm: Normal rate and regular rhythm.  Pulmonary:     Effort: Pulmonary effort is normal. No respiratory distress.     Breath sounds: No wheezing.  Abdominal:     General: Bowel sounds are normal. There is no distension or abdominal bruit.     Palpations: Abdomen is soft. There is no hepatomegaly or  splenomegaly.     Tenderness: There is abdominal tenderness in the right upper quadrant. Negative signs include Murphy's sign and Rovsing's sign.  Skin:    Coloration: Skin is not jaundiced or pale.  Neurological:     Mental Status: She is alert and oriented to person, place, and time.      UC Treatments / Results  Labs (all labs ordered are listed, but only abnormal results are displayed) Labs Reviewed  CBC WITH DIFFERENTIAL/PLATELET  COMPREHENSIVE METABOLIC PANEL  LIPID PANEL  AMYLASE  LIPASE    EKG   Radiology No results found.  Procedures Procedures (including critical care time)  Medications Ordered in UC Medications - No data to display  Initial Impression / Assessment and Plan / UC Course  I have reviewed the triage vital signs and the nursing notes.  Pertinent labs & imaging results that were available during my care of the patient were reviewed by me and considered in my medical decision making (see chart for details).     Patient afebrile, nontoxic in office today.  No jaundice or scleral icterus, emesis, nausea, respiratory compromise.  Will obtain basic labs, have patient keep follow-up appoint with PCP for this Thursday for further evaluation thereof.  Trial NSAIDs and keep log of symptoms in the interim.  Return precautions discussed, patient verbalized understanding and is agreeable to plan. Final Clinical Impressions(s) / UC Diagnoses   Final diagnoses:  RUQ pain     Discharge Instructions     Important to keep log of symptoms to review with your primary care this Thursday. Blood work pending: We will call you with the results later this evening. Go to ER for worsening pain, fever, vomiting, blood in stool.    ED Prescriptions    Medication Sig Dispense Auth. Provider   naproxen (NAPROSYN) 500 MG tablet Take 1 tablet (500 mg total) by mouth 2 (two) times daily. 30 tablet Hall-Potvin, [place], PA-C     PDMP not reviewed this encounter.     Hall-Potvin, [place], [place] 05/01/20 1407

## 2020-05-01 NOTE — ED Triage Notes (Signed)
Pt c/o RUQ pain on certain movements x3 episodes in the past 3 wks. States this morning she moved the wrong way and pain to RUQ about took her breath away. States now just a dull pain.

## 2020-05-02 ENCOUNTER — Other Ambulatory Visit: Payer: Self-pay | Admitting: Adult Health

## 2020-05-02 ENCOUNTER — Other Ambulatory Visit: Payer: Self-pay | Admitting: Family Medicine

## 2020-05-04 ENCOUNTER — Encounter: Payer: Self-pay | Admitting: Physician Assistant

## 2020-05-04 ENCOUNTER — Ambulatory Visit (INDEPENDENT_AMBULATORY_CARE_PROVIDER_SITE_OTHER): Payer: Managed Care, Other (non HMO) | Admitting: Physician Assistant

## 2020-05-04 ENCOUNTER — Other Ambulatory Visit: Payer: Self-pay

## 2020-05-04 VITALS — BP 115/82 | HR 89 | Temp 98.1°F | Ht 67.5 in | Wt 189.4 lb

## 2020-05-04 DIAGNOSIS — I10 Essential (primary) hypertension: Secondary | ICD-10-CM | POA: Diagnosis not present

## 2020-05-04 DIAGNOSIS — R7989 Other specified abnormal findings of blood chemistry: Secondary | ICD-10-CM

## 2020-05-04 DIAGNOSIS — F17200 Nicotine dependence, unspecified, uncomplicated: Secondary | ICD-10-CM

## 2020-05-04 DIAGNOSIS — R1011 Right upper quadrant pain: Secondary | ICD-10-CM | POA: Diagnosis not present

## 2020-05-04 NOTE — Patient Instructions (Signed)
Complete Blood Count Why am I having this test? A complete blood count (CBC) is a blood test that measures the cells of your blood. The types of cells are red blood cells (RBCs), white blood cells (WBCs), and blood cell fragments that help with clotting (platelets). Changes in these cells can warn your health care provider about many conditions, including anemia, infection, inflammation, bleeding, and blood-related cancer. You may have this test:  As part of a routine physical exam.  To help your health care provider diagnose certain health conditions.  To help your health care provider monitor known health conditions or treatments. What is being tested? A CBC measures your RBCs, WBCs, platelets, and their different components. RBCs are made in the tissue inside your bones (bone marrow) and released into your blood. These cells contain a protein that carries oxygen in your blood (hemoglobin). CBC testing of RBCs includes:  RBC count. This is the total number of RBCs.  Hemoglobin (Hb). This is the total amount of hemoglobin.  Hematocrit (Hct). This is the percentage of space that red blood cells take up in your blood.  Mean corpuscular volume (MCV). This is the average size of red blood cells.  Mean corpuscular hemoglobin [facility]). This is the average amount of hemoglobin inside each red blood cell.  Mean corpuscular hemoglobin concentration (MCHC). This is the average concentration of hemoglobin inside each red blood cell.  Red blood cell distribution width (RDW). This may be included to measure the variation in the size of red blood cells. WBCs are also made in your bone marrow. They are part of your body's disease-fighting system (immune system). CBC testing of WBCs includes:  WBC count. This is the total number of WBCs.  A count of the five types of WBCs: ? Neutrophils. ? Lymphocytes. ? Monocytes. ? Eosinophils. ? Basophils. Platelets are cell fragments that are important for  blood clotting. A CBC measures:  Total platelet count.  Mean platelet volume (MPV). What kind of sample is taken?  A blood sample is required for this test. It is usually collected by inserting a needle into a blood vessel. How are the results reported? Your test results will be reported as values. Your health care provider will compare your results to normal ranges that were established after testing a large group of people (reference ranges). Reference ranges may vary among labs and hospitals. Reference ranges for a CBC usually apply only to people who are older than 18. For this test, common reference ranges for people older than 18 may be: Red blood cells  RBC count ? Men: 4.7-6.1 ? Women: 4.2-5.4  Hb ? Men: 30-18 ? Women: 12-16  Hct ? Men: 42-52% ? Women: 37-47%  MCV: 80-95  MCH: 27-31  MCHC: 32-36  RDW: 11-14.5% White blood cells  WBC count: 5,000-10,000  Neutrophil count: 2500-8000 or 55-70%  Lymphocyte count: 1000-4000 or 20-40%  Monocyte count: 100-700 or 2-8%  Eosinophil count: 50-500 or 1-4%  Basophil count: 25-100 or 0.5-1% Platelets  Platelet count: 150,000-400,000  MPV: 7.4-10.4 What do the results mean? Results that are outside the reference ranges may indicate that you have an infection or other health condition. Red blood cells  RBC count, Hb, and Hct ? Results lower than the reference ranges may indicate anemia. Anemia may be caused by several conditions, such as iron deficiency anemia. ? Results higher than the reference ranges may indicate polycythemia. This may be caused by several conditions, such as chronic obstructive pulmonary disease (COPD).  MCV, MCH, MCHC, and RDW ? Results outside the reference ranges may indicate a condition that causes anemia. White blood cells  WBC count ? Results lower than the reference range may indicate an infection or a condition that keeps your bone marrow from making new WBCs. ? Results higher than  the reference range may indicate an infection, a condition that causes inflammation (autoimmune disease), or a blood-related cancer.  Neutrophils, lymphocytes, and monocytes ? Results outside the reference ranges may indicate an infection, an autoimmune disease, or certain types of cancer.  Eosinophils and basophils ? Results outside the reference ranges may indicate an allergy, an inflammatory condition such as asthma, or blood cell cancer. Platelets  Platelet count ? Results lower than the reference range may indicate an infection or blood cell cancer. ? Results higher than the reference range may indicate certain types of anemia, an autoimmune disease, or certain cancers.  MPV ? High or low results may indicate a bone marrow abnormality. Talk with your health care provider about what your results mean. Questions to ask your health care provider Ask your health care provider or the department that is doing the test:  When will my results be ready?  How will I get my results?  What are my treatment options?  What other tests do I need?  What are my next steps? Summary  A CBC is a routine blood test that measures the cells in your blood.  Changes in these cells can indicate many conditions, including anemia, infection, inflammation, and blood cell cancer.  A health care provider will collect a sample of your blood for this test by inserting a needle into a blood vessel.  Talk with your health care provider about what your results mean. This information is not intended to replace advice given to you by your health care provider. Make sure you discuss any questions you have with your health care provider. Document Revised: 12/16/2017 Document Reviewed: 12/16/2017 Elsevier Patient Education  [place].

## 2020-05-04 NOTE — Progress Notes (Signed)
Established Patient Office Visit  Subjective:  Patient ID: Allison Wallace, female    DOB: 06-May-1971  Age: 49 y.o. MRN: [id number]  CC:  Chief Complaint  Patient presents with  . Hypertension    HPI ZSAZSA BROHL presents for follow-up on hypertension.  HTN: Pt denies chest pain, palpitations, or dizziness. Taking medication as directed without side effects. Doesn't check BP at home because she feels like the BP machine she has isn't accurate. Pt reports intermittent lower extremity swelling mostly around her ankles. States she does sit a lot and thinks that is contributing to her ankles swelling. Denies dyspnea.   RUQ pain: Pt recently went to UC on 05/01/20 for RUQ pain below ribcage. States her pain is better. She has noticed the pain is exacerbated with certain movements and feels like she might of pulled a muscle. Denies n/v/d. She hasn't picked up anti-inflammatory (Naproxen) prescribed by UC but plans to.    Past Medical History:  Diagnosis Date  . Elevated cholesterol   . GERD (gastroesophageal reflux disease)   . Hypertension   . Tobacco use   . Tremor of unknown origin     History reviewed. No pertinent surgical history.  Family History  Problem Relation Age of Onset  . Hyperlipidemia Father     Social History   Socioeconomic History  . Marital status: Married    Spouse name: Not on file  . Number of children: Not on file  . Years of education: Not on file  . Highest education level: Not on file  Occupational History  . Not on file  Tobacco Use  . Smoking status: Current Every Day Smoker    Types: Cigarettes  . Smokeless tobacco: Current User  . Tobacco comment: 1 pack every 2 days  Substance and Sexual Activity  . Alcohol use: Yes    Alcohol/week: 4.0 standard drinks    Types: 4 Glasses of wine per week  . Drug use: No  . Sexual activity: Not on file  Other Topics Concern  . Not on file  Social History Narrative  . Not on file    Social Determinants of Health   Financial Resource Strain:   . Difficulty of Paying Living Expenses:   Food Insecurity:   . Worried About Charity fundraiser in the Last Year:   . Arboriculturist in the Last Year:   Transportation Needs:   . Film/video editor (Medical):   Marland Kitchen Lack of Transportation (Non-Medical):   Physical Activity:   . Days of Exercise per Week:   . Minutes of Exercise per Session:   Stress:   . Feeling of Stress :   Social Connections:   . Frequency of Communication with Friends and Family:   . Frequency of Social Gatherings with Friends and Family:   . Attends Religious Services:   . Active Member of Clubs or Organizations:   . Attends Archivist Meetings:   Marland Kitchen Marital Status:   Intimate Partner Violence:   . Fear of Current or Ex-Partner:   . Emotionally Abused:   Marland Kitchen Physically Abused:   . Sexually Abused:     Outpatient Medications Prior to Visit  Medication Sig Dispense Refill  . Ivermectin (SOOLANTRA) 1 % CREA Apply topically.    Marland Kitchen lisinopril (ZESTRIL) 5 MG tablet TAKE 1 TABLET (5 MG TOTAL) BY MOUTH DAILY. **PATIENT NEEDS APT FOR FURTHER REFILLS** 30 tablet 0  . minocycline (DYNACIN) 100 MG tablet Take 100  mg by mouth 2 (two) times daily.    . Multiple Vitamin (ONE-A-DAY ADULT VITACRAVES+DHA) CHEW Chew by mouth.    . naproxen (NAPROSYN) 500 MG tablet Take 1 tablet (500 mg total) by mouth 2 (two) times daily. 30 tablet 0  . omeprazole (PRILOSEC) 20 MG capsule TAKE 1 CAPSULE BY MOUTH EVERY DAY 15 capsule 0   No facility-administered medications prior to visit.    No Known Allergies  ROS Review of Systems   A fourteen system review of systems was performed and found to be positive as per HPI.  Objective:    Physical Exam  General: Well nourished, in no apparent distress. Eyes: PERRLA, EOMs, conjunctiva clr Resp: Respiratory effort- normal, ECTA B/L  Cardio: RRR . Abdomen: no gross distention. Lymphatics:  less 2 sec cap  RF M-sk: Full ROM, 5/5 strength, normal gait. Minimal non-pitting edema noted, L>R.  Skin: Warm, dry  Neuro: Alert, Oriented Psych: Normal affect, Insight and Judgment appropriate.    BP 115/82   Pulse 89   Temp 98.1 F (36.7 C) (Oral)   Ht 5' 7.5" (1.715 m)   Wt 189 lb 6.4 oz (85.9 kg)   LMP 04/19/2020   SpO2 99% Comment: on RA  BMI 29.23 kg/m  Wt Readings from Last 3 Encounters:  05/04/20 189 lb 6.4 oz (85.9 kg)  11/11/19 188 lb 1.6 oz (85.3 kg)  10/27/19 184 lb (83.5 kg)     Health Maintenance Due  Topic Date Due  . HIV Screening  Never done  . COVID-19 Vaccine (1) Never done    There are no preventive care reminders to display for this patient.  Lab Results  Component Value Date   TSH 1.370 10/22/2019   Lab Results  Component Value Date   WBC 7.9 05/01/2020   HGB 16.5 (H) 05/01/2020   HCT 48.5 (H) 05/01/2020   MCV 108 (H) 05/01/2020   PLT 267 05/01/2020   Lab Results  Component Value Date   NA 137 05/01/2020   K 4.5 05/01/2020   CO2 21 05/01/2020   GLUCOSE 98 05/01/2020   BUN 10 05/01/2020   CREATININE 0.69 05/01/2020   BILITOT 0.6 05/01/2020   ALKPHOS 106 05/01/2020   AST 20 05/01/2020   ALT 22 05/01/2020   PROT 6.6 05/01/2020   ALBUMIN 4.4 05/01/2020   CALCIUM 9.3 05/01/2020   Lab Results  Component Value Date   CHOL 191 10/22/2019   Lab Results  Component Value Date   HDL 51 10/22/2019   Lab Results  Component Value Date   LDLCALC 120 (H) 10/22/2019   Lab Results  Component Value Date   TRIG 109 10/22/2019   Lab Results  Component Value Date   CHOLHDL 3.7 10/22/2019   Lab Results  Component Value Date   HGBA1C 5.1 10/22/2019      Assessment & Plan:   Problem List Items Addressed This Visit      Cardiovascular and Mediastinum   HTN, goal below 130/80 - Primary     Other   Abnormal CBC    Other Visit Diagnoses    RUQ pain         HTN: - BP today is 115/82, stable. - Continue Lisinopril 5 mg. - Encourage to get  a new BP machine to check BP and pulse at home a few times per week.  - Continue DASH diet. - Encourage to stay as active as possible.  RUQ: - Reviewed UC labs and most labs within normal  limits except CBC. - Pt's pain is better and recommended to start Naproxen as prescribed by UC which should help provide pain relief if pain is muscular related. - Follow-up if symptoms fail to improve or worsen.  Abnormal CBC: - H/H increased which is stable and unchanged from prior, most likely related to tobacco use. - Will continue to monitor.  No orders of the defined types were placed in this encounter.   Follow-up: Return for CPE and FBW in 5-6 months.    Lorrene Reid, PA-C

## 2020-05-14 ENCOUNTER — Other Ambulatory Visit: Payer: Self-pay | Admitting: Physician Assistant

## 2020-08-23 ENCOUNTER — Telehealth: Payer: Self-pay | Admitting: Physician Assistant

## 2020-08-23 MED ORDER — LISINOPRIL 5 MG PO TABS: 5.0000 mg | ORAL_TABLET | Freq: Every day | ORAL | 0 refills | Status: DC

## 2020-08-23 NOTE — Telephone Encounter (Signed)
Refill sent to pharmacy. AS, CMA 

## 2020-08-23 NOTE — Addendum Note (Signed)
Addended by: Mickel Crow on: 08/23/2020 03:06 PM   Modules accepted: Orders

## 2020-08-23 NOTE — Telephone Encounter (Signed)
Patient is requesting a refill of her lisinopril, she says [facility] and that they sent [place] a request (which I do not see we got). She is out of this med and is [facility] [place].

## 2020-10-31 ENCOUNTER — Other Ambulatory Visit: Payer: Self-pay

## 2020-10-31 ENCOUNTER — Other Ambulatory Visit: Payer: Managed Care, Other (non HMO)

## 2020-10-31 ENCOUNTER — Other Ambulatory Visit: Payer: Self-pay | Admitting: Physician Assistant

## 2020-10-31 DIAGNOSIS — I10 Essential (primary) hypertension: Secondary | ICD-10-CM

## 2020-10-31 DIAGNOSIS — Z0000 Encounter for general adult medical examination without abnormal findings: Secondary | ICD-10-CM

## 2020-10-31 DIAGNOSIS — E7800 Pure hypercholesterolemia, unspecified: Secondary | ICD-10-CM

## 2020-11-01 LAB — COMPREHENSIVE METABOLIC PANEL
ALT: 15 IU/L (ref 0–32)
AST: 17 IU/L (ref 0–40)
Albumin/Globulin Ratio: 2.1 (ref 1.2–2.2)
Albumin: 4.1 g/dL (ref 3.8–4.8)
Alkaline Phosphatase: 100 IU/L (ref 44–121)
BUN/Creatinine Ratio: 16 (ref 9–23)
BUN: 11 mg/dL (ref 6–24)
Bilirubin Total: 0.3 mg/dL (ref 0.0–1.2)
CO2: 25 mmol/L (ref 20–29)
Calcium: 9.2 mg/dL (ref 8.7–10.2)
Chloride: 106 mmol/L (ref 96–106)
Creatinine, Ser: 0.68 mg/dL (ref 0.57–1.00)
GFR calc Af Amer: 118 mL/min/{1.73_m2} (ref >59)
GFR calc non Af Amer: 102 mL/min/{1.73_m2} (ref >59)
Globulin, Total: 2 g/dL (ref 1.5–4.5)
Glucose: 90 mg/dL (ref 65–99)
Potassium: 4.9 mmol/L (ref 3.5–5.2)
Sodium: 141 mmol/L (ref 134–144)
Total Protein: 6.1 g/dL (ref 6.0–8.5)

## 2020-11-01 LAB — CBC
Hematocrit: 46.2 % (ref 34.0–46.6)
Hemoglobin: 15.8 g/dL (ref 11.1–15.9)
MCH: 36.3 pg — ABNORMAL HIGH (ref 26.6–33.0)
MCHC: 34.2 g/dL (ref 31.5–35.7)
MCV: 106 fL — ABNORMAL HIGH (ref 79–97)
Platelets: 263 10*3/uL (ref 150–450)
RBC: 4.35 x10E6/uL (ref 3.77–5.28)
RDW: 12 % (ref 11.7–15.4)
WBC: 5.7 10*3/uL (ref 3.4–10.8)

## 2020-11-01 LAB — LIPID PANEL
Chol/HDL Ratio: 4.2 ratio (ref 0.0–4.4)
Cholesterol, Total: 233 mg/dL — ABNORMAL HIGH (ref 100–199)
HDL: 55 mg/dL (ref >39)
LDL Chol Calc (NIH): 154 mg/dL — ABNORMAL HIGH (ref 0–99)
Triglycerides: 134 mg/dL (ref 0–149)
VLDL Cholesterol Cal: 24 mg/dL (ref 5–40)

## 2020-11-01 LAB — TSH: TSH: 1.92 u[IU]/mL (ref 0.450–4.500)

## 2020-11-01 LAB — HEMOGLOBIN A1C
Est. average glucose Bld gHb Est-mCnc: 94 mg/dL
Hgb A1c MFr Bld: 4.9 % (ref 4.8–5.6)

## 2020-11-03 ENCOUNTER — Other Ambulatory Visit: Payer: Self-pay

## 2020-11-03 ENCOUNTER — Encounter: Payer: Self-pay | Admitting: Physician Assistant

## 2020-11-03 ENCOUNTER — Ambulatory Visit (INDEPENDENT_AMBULATORY_CARE_PROVIDER_SITE_OTHER): Payer: Managed Care, Other (non HMO) | Admitting: Physician Assistant

## 2020-11-03 VITALS — BP 123/86 | HR 82 | Temp 98.2°F | Ht 67.5 in | Wt 184.3 lb

## 2020-11-03 DIAGNOSIS — Z7289 Other problems related to lifestyle: Secondary | ICD-10-CM

## 2020-11-03 DIAGNOSIS — Z789 Other specified health status: Secondary | ICD-10-CM

## 2020-11-03 DIAGNOSIS — I10 Essential (primary) hypertension: Secondary | ICD-10-CM

## 2020-11-03 DIAGNOSIS — Z720 Tobacco use: Secondary | ICD-10-CM

## 2020-11-03 DIAGNOSIS — Z1159 Encounter for screening for other viral diseases: Secondary | ICD-10-CM | POA: Diagnosis not present

## 2020-11-03 DIAGNOSIS — Z23 Encounter for immunization: Secondary | ICD-10-CM

## 2020-11-03 DIAGNOSIS — Z114 Encounter for screening for human immunodeficiency virus [HIV]: Secondary | ICD-10-CM

## 2020-11-03 DIAGNOSIS — Z1211 Encounter for screening for malignant neoplasm of colon: Secondary | ICD-10-CM

## 2020-11-03 DIAGNOSIS — Z0000 Encounter for general adult medical examination without abnormal findings: Secondary | ICD-10-CM

## 2020-11-03 DIAGNOSIS — F1090 Alcohol use, unspecified, uncomplicated: Secondary | ICD-10-CM

## 2020-11-03 NOTE — Progress Notes (Addendum)
Female Physical   Impression and Recommendations:    1. Healthcare maintenance   2. Screening for HIV (human immunodeficiency virus)   3. Need for hepatitis C screening test   4. Need for influenza vaccination   5. Screening for colon cancer   6. HTN, goal below 130/80   7. Tobacco use   8. Alcohol use      1) Anticipatory Guidance: Discussed skin CA prevention and sunscreen when outside along with skin surveillance; eating a balanced and modest diet; physical activity at least 25 minutes per day or minimum of 150 min/ week moderate to intense activity.  2) Immunizations / Screenings / Labs:   All immunizations are up-to-date per recommendations or will be updated today if pt allows.    - Patient understands with dental and vision screens they will schedule independently.  - Obtained CBC, CMP, HgA1c, Lipid panel, TSH and vit D when fasting.  Most labs are essentially within normal limits or stable from prior.  Lipid panel increased, CBC has improved. -UTD on Pap smear (followed by OB/GYN), plans to schedule mammogram with OB/GYN. -UTD on Tdap. -Agreeable to influenza vaccine, hep C and HIV screenings.  3) Weight:  Discussed goal to improve diet habits to improve overall feelings of well being and objective health data. Improve nutrient density of diet through increasing intake of fruits and vegetables and decreasing saturated fats, white flour products and refined sugars.  4) Healthcare maintenance: -Continue current medication regimen. -BP within normal limits. -Follow a heart healthy diet and reduce saturated and transfats.  Continue with walking regimen. -Encourage to continue to reduce tobacco use and recommend to reduce alcohol use to no more than 1 drink/day or 7 drinks or less per wk. -Patient will notify the the office if decides to pursue referral to general surgery for umbilical hernia. -Follow-up in 6 months for regular OV: HTN, GERD   No orders of the defined types  were placed in this encounter.   Orders Placed This Encounter  Procedures  . Flu Vaccine QUAD 36+ mos IM  . Hepatitis C antibody  . HIV Antibody (routine testing w rflx)  . Ambulatory referral to Gastroenterology     Return in about 6 months (around 05/03/2021) for HTN, GERD.     Gross side effects, risk and benefits, and alternatives of medications discussed with patient.  Patient is aware that all medications have potential side effects and we are unable to predict every side effect or drug-drug interaction that may occur.  Expresses verbal understanding and consents to current therapy plan and treatment regimen.  F-up preventative CPE in 1 year- this is in addition to any chronic care visits.    Please see orders placed and AVS handed out to patient at the end of our visit for further patient instructions/ counseling done pertaining to today's office visit.   Note:  This note was prepared with assistance of Dragon voice recognition software. Occasional wrong-word or sound-a-like substitutions may have occurred due to the inherent limitations of voice recognition software.    Subjective:     CPE HPI: Allison Wallace is a 49 y.o. female who presents to [place] [facility] today a yearly health maintenance exam.   Health Maintenance Summary  - Reviewed and updated, unless pt declines services.  Last Cologuard or Colonoscopy:    Placed order for screening colonoscopy. Family history of Colon CA: No  Tobacco History Reviewed:  Y, current smoker.  Has reduced to  1/2 PPD Alcohol and/or drug use:    Has more than 7 drinks per week; no use Exercise Habits: Walking  Dental Home: Y Eye exams: Y Dermatology home: Y  Female Health:  PAP Smear - last known results:  08/26/2018- normal STD concerns:   none Lumps or breast concerns:  none Breast Cancer Family History:  No    Additional concerns beyond health maintenance issues: none    Immunization  History  Administered Date(s) Administered  . Influenza,inj,Quad PF,6+ Mos 11/03/2020  . Moderna SARS-COVID-2 Vaccination 07/20/2020, 08/17/2020  . Tdap 07/06/2018     Health Maintenance  Topic Date Due  . MAMMOGRAM  09/30/2020  . COLONOSCOPY  Never done  . PAP SMEAR-Modifier  08/26/2021  . TETANUS/TDAP  07/06/2028  . INFLUENZA VACCINE  Completed  . COVID-19 Vaccine  Completed  . Hepatitis C Screening  Completed  . HIV Screening  Completed     Wt Readings from Last 3 Encounters:  11/03/20 184 lb 4.8 oz (83.6 kg)  05/04/20 189 lb 6.4 oz (85.9 kg)  11/11/19 188 lb 1.6 oz (85.3 kg)   BP Readings from Last 3 Encounters:  11/03/20 123/86  05/04/20 115/82  05/01/20 (!) 167/108   Pulse Readings from Last 3 Encounters:  11/03/20 82  05/04/20 89  05/01/20 85     Past Medical History:  Diagnosis Date  . Elevated cholesterol   . GERD (gastroesophageal reflux disease)   . Hypertension   . Tobacco use   . Tremor of unknown origin       History reviewed. No pertinent surgical history.    Family History  Problem Relation Age of Onset  . Hyperlipidemia Father       Social History   Substance and Sexual Activity  Drug Use No  ,   Social History   Substance and Sexual Activity  Alcohol Use Yes  . Alcohol/week: 4.0 standard drinks  . Types: 4 Glasses of wine per week  ,   Social History   Tobacco Use  Smoking Status Current Every Day Smoker  . Types: Cigarettes  Smokeless Tobacco Current User  Tobacco Comment   1 pack every 2 days  ,   Social History   Substance and Sexual Activity  Sexual Activity Not on file    Current Outpatient Medications on File Prior to Visit  Medication Sig Dispense Refill  . Ivermectin (SOOLANTRA) 1 % CREA Apply topically.    Marland Kitchen lisinopril (ZESTRIL) 5 MG tablet Take 1 tablet (5 mg total) by mouth daily. 90 tablet 0  . minocycline (DYNACIN) 100 MG tablet Take 100 mg by mouth 2 (two) times daily.    . Multiple Vitamin  (ONE-A-DAY ADULT VITACRAVES+DHA) CHEW Chew by mouth.    . naproxen (NAPROSYN) 500 MG tablet Take 1 tablet (500 mg total) by mouth 2 (two) times daily. 30 tablet 0  . omeprazole (PRILOSEC) 20 MG capsule TAKE 1 CAPSULE BY MOUTH EVERY DAY 15 capsule 0   No current facility-administered medications on file prior to visit.    Allergies: Patient has no known allergies.  Review of Systems: General:   Denies fever, chills, unexplained weight loss.  Optho/Auditory:   Denies visual changes, blurred vision/LOV Respiratory:   Denies SOB, DOE more than baseline levels.   Cardiovascular:   Denies chest pain, palpitations, new onset peripheral edema  Gastrointestinal:   Denies nausea, vomiting, diarrhea.  Genitourinary: Denies dysuria, freq/ urgency, flank pain  Endocrine:     Denies hot or cold intolerance,  polyuria, polydipsia. Musculoskeletal:   Denies unexplained myalgias, joint swelling, unexplained arthralgias, gait problems.  Skin:  Denies rash, suspicious lesions Neurological:     Denies dizziness, unexplained weakness, numbness  Psychiatric/Behavioral:   Denies mood changes, suicidal or homicidal ideations, hallucinations    Objective:    Blood pressure 123/86, pulse 82, temperature 98.2 F (36.8 C), temperature source Oral, height 5' 7.5" (1.715 m), weight 184 lb 4.8 oz (83.6 kg), SpO2 98 %. Body mass index is 28.44 kg/m. General Appearance:    Alert, cooperative, no distress, appears stated age  Head:    Normocephalic, without obvious abnormality, atraumatic  Eyes:    PERRL, conjunctiva/corneas clear, EOM's intact, both eyes  Ears:    Normal TM's and external ear canals, both ears  Nose:   Nares normal, septum midline, mucosa normal, no drainage    or sinus tenderness  Throat:   Lips w/o lesion, mucosa moist, and tongue normal; teeth and   gums normal  Neck:   Supple, symmetrical, trachea midline, no adenopathy;    thyroid:  no enlargement/tenderness/nodules;  Back:     Symmetric,  no curvature, ROM normal, no CVA tenderness  Lungs:     Clear to auscultation bilaterally, respirations unlabored, no       Wh/ R/ R  Chest Wall:    No tenderness or gross deformity; normal excursion   Heart:    Regular rate and rhythm, S1 and S2 normal, no murmur, rub   or gallop  Breast Exam:   Deferred to OB/GYN  Abdomen:     Soft, non-tender, bowel sounds active all four quadrants, No   G/R/R, no masses, no organomegaly, small reducible umbilical hernia present  Genitalia:   Deferred to OB/GYN  Rectal:   Deferred to OB/GYN  Extremities:   Extremities normal, atraumatic, no cyanosis or gross edema  Pulses:   2+ and symmetric all extremities  Skin:   Warm, dry, Skin color, texture, turgor normal, no obvious rashes or lesions Psych: No HI/SI, judgement and insight good, Euthymic mood. Full Affect.  Neurologic:   CNII-XII grossly intact, normal strength, sensation and reflexes throughout

## 2020-11-03 NOTE — Patient Instructions (Signed)

## 2020-11-04 LAB — HEPATITIS C ANTIBODY: Hep C Virus Ab: <0.1 s/co ratio (ref 0.0–0.9)

## 2020-11-04 LAB — HIV ANTIBODY (ROUTINE TESTING W REFLEX): HIV Screen 4th Generation wRfx: NONREACTIVE

## 2020-11-22 ENCOUNTER — Telehealth: Payer: Self-pay | Admitting: Physician Assistant

## 2020-11-22 MED ORDER — LISINOPRIL 5 MG PO TABS: 5.0000 mg | ORAL_TABLET | Freq: Every day | ORAL | 0 refills | Status: DC

## 2020-11-22 NOTE — Telephone Encounter (Signed)
Med refill sent to pharmacy. AS, CMA 

## 2020-11-22 NOTE — Addendum Note (Signed)
Addended by: Mickel Crow on: 11/22/2020 03:51 PM   Modules accepted: Orders

## 2020-11-22 NOTE — Telephone Encounter (Signed)
Patient needs a refill on [facility]opril[facility] [address], [facility]

## 2021-02-01 ENCOUNTER — Other Ambulatory Visit: Payer: Self-pay

## 2021-02-01 ENCOUNTER — Ambulatory Visit (AMBULATORY_SURGERY_CENTER): Payer: Self-pay | Admitting: *Deleted

## 2021-02-01 VITALS — Ht 67.5 in | Wt 191.0 lb

## 2021-02-01 DIAGNOSIS — Z1211 Encounter for screening for malignant neoplasm of colon: Secondary | ICD-10-CM

## 2021-02-01 NOTE — Progress Notes (Signed)
No egg or soy allergy known to patient  No issues with past sedation with any surgeries or procedures No intubation problems in the past  No FH of Malignant Hyperthermia No diet pills per patient No home 02 use per patient  No blood thinners per patient  Pt denies issues with constipation  No A fib or A flutter  EMMI video to pt or via [place] 19 guidelines implemented in PV today with Pt and RN  Pt is fully vaccinated  for [employer] given to pt in PV today , Code to Pharmacy and  NO PA's for preps discussed with pt  In PV today   Due to the COVID-19 pandemic we are asking patients to follow certain guidelines.  Pt aware of COVID protocols and [facility] guidelines    Pt states at [facility] her PCP referred her for an EGD as well- Pt states she has dysphagia, food is getting stuck- no GI hx- OV made for 2-18 with Carl Best to evaluate and instructed she can then RS ECL after OV

## 2021-02-15 ENCOUNTER — Encounter: Payer: Managed Care, Other (non HMO) | Admitting: Gastroenterology

## 2021-02-16 ENCOUNTER — Ambulatory Visit: Payer: Managed Care, Other (non HMO) | Admitting: Nurse Practitioner

## 2021-02-16 ENCOUNTER — Encounter: Payer: Self-pay | Admitting: Nurse Practitioner

## 2021-02-16 VITALS — BP 112/80 | HR 88 | Ht 66.75 in | Wt 192.4 lb

## 2021-02-16 DIAGNOSIS — Z1211 Encounter for screening for malignant neoplasm of colon: Secondary | ICD-10-CM | POA: Diagnosis not present

## 2021-02-16 DIAGNOSIS — K219 Gastro-esophageal reflux disease without esophagitis: Secondary | ICD-10-CM

## 2021-02-16 DIAGNOSIS — R1310 Dysphagia, unspecified: Secondary | ICD-10-CM

## 2021-02-16 MED ORDER — SUPREP BOWEL PREP KIT 17.5-3.13-1.6 GM/177ML PO SOLN: 1.0000 | ORAL | 0 refills | Status: DC

## 2021-02-16 NOTE — Progress Notes (Signed)
02/16/2021 Allison Wallace [id number] June 10, 1971   CHIEF COMPLAINT:   HISTORY OF PRESENT ILLNESS: Allison Hitch. Battisti is a 50 year old female with a past medical history of hypertension and GERD. She presents to our office today as referred by Allison Reid PA-C to schedule an EGD and colonoscopy. She reports having a history of GERD which was fairly well controlled when she took Omeprazole approximately 2 years ago. She ran out of her Omeprazole prescription and her reflux symptoms recurred 1 year ago. She takes Tums as needed. She has daily heartburn. She sometimes has nighttime regurgitation. She complains of odynophagia and dysphagia which occurs once or twice every 2 weeks for the past 6 months. She describes swallowing food such as bread, rice or boiled egg which briefly get stuck in the upper esophagus with associated pain and then the stuck food passes down the esophagus in a few minutes. Drinking water during these episodes results in worse symptoms, water pools in her upper esophagus. She vomited out the stuck food on one occasion. She takes 2 Aleve tablets approximately 8 days monthly for aches and pains. She is passing a normal formed brown bowel movement daily. No rectal bleeding or melena. No known family history of esophageal, gastric or colon cancer. Sister with history of ulcerative colitis.   Past Medical History:  Diagnosis Date  . Arthritis    mild lower back   . Elevated cholesterol   . GERD (gastroesophageal reflux disease)   . Hypertension   . Tobacco use   . Tremor of unknown origin    Past Surgical History:  Procedure Laterality Date  . ECTOPIC PREGNANCY SURGERY      Social History: She is married. She has 2 sons. She is a Photographer. She smokes cigarettes 1/2 pack/day. She drinks 2 alcoholic beverages daily. No drug use.  Family history: Mother with history of atrial fibrillation and GERD. Father with history of liver cancer. Paternal  grandfather with kidney failure. Maternal grandmother with history of bone cancer.  No Known Allergies    Outpatient Encounter Medications as of 02/16/2021  Medication Sig  . calcium elemental as carbonate (BARIATRIC TUMS ULTRA) 400 MG chewable tablet Chew 2,000-3,000 mg by mouth as needed for heartburn.  . Ivermectin 1 % CREA Apply topically.  Marland Kitchen lisinopril (ZESTRIL) 5 MG tablet Take 1 tablet (5 mg total) by mouth daily.  . minocycline (DYNACIN) 100 MG tablet Take 100 mg by mouth 2 (two) times daily.  . Multiple Vitamin (ONE-A-DAY ADULT VITACRAVES+DHA) CHEW Chew by mouth.  . [DISCONTINUED] naproxen (NAPROSYN) 500 MG tablet Take 1 tablet (500 mg total) by mouth 2 (two) times daily. (Patient not taking: No sig reported)  . [DISCONTINUED] omeprazole (PRILOSEC) 20 MG capsule TAKE 1 CAPSULE BY MOUTH EVERY DAY (Patient not taking: Reported on 02/01/2021)   No facility-administered encounter medications on file as of 02/16/2021.   REVIEW OF SYSTEMS: Gen: Denies fever, sweats or chills. No weight loss.  CV: Denies chest pain, palpitations or edema. Resp: Denies cough, shortness of breath of hemoptysis.  GI: See HPI.  GU : Denies urinary burning, blood in urine, increased urinary frequency or incontinence. MS: + back pain.  Derm: Denies rash, itchiness, skin lesions or unhealing ulcers. Psych: Denies depression, anxiety or memory loss. Heme: Denies bruising, bleeding. Neuro:  Denies headaches, dizziness or paresthesias. Endo:  Denies any problems with DM, thyroid or adrenal function.  PHYSICAL EXAM: BP 112/80 (BP Location: Left Arm, Patient Position: Sitting, Cuff Size: Normal)  Pulse 88   Ht 5' 6.75" (1.695 m) Comment: height measured without shoes  Wt 192 lb 6 oz (87.3 kg)   LMP 01/30/2021   BMI 30.36 kg/m  General: Well developed 50 year old female in no acute distress. Head: Normocephalic and atraumatic. Eyes:  Sclerae non-icteric, conjunctive pink. Ears: Normal auditory  acuity. Mouth: Dentition intact. No ulcers or lesions.  Neck: Supple, no lymphadenopathy or thyromegaly.  Lungs: Clear bilaterally to auscultation without wheezes, crackles or rhonchi. Heart: Regular rate and rhythm. No murmur, rub or gallop appreciated.  Abdomen: Soft, nontender, non distended. No masses. No hepatosplenomegaly. Normoactive bowel sounds x 4 quadrants.  Rectal: Deferred. Musculoskeletal: Symmetrical with no gross deformities. Skin: Warm and dry. No rash or lesions on visible extremities. Extremities: No edema. Neurological: Alert oriented x 4, no focal deficits.  Psychological:  Alert and cooperative. Normal mood and affect.  ASSESSMENT AND PLAN:  48. 50 year old female with GERD symptoms x 1 year, odynophagia and dysphagia x 6 months  -Omeprazole 20 mg once daily -GERD handout -EGD benefits and risks discussed including risk with sedation, risk of bleeding, perforation and infection  -Reduce Aleve/NSAID use -Avoid foods such as dry bread, chicken, steak and rice -Patient to call our office if her symptoms worsen  2. Colon cancer screening -Colonoscopy benefits and risks discussed including risk with sedation, risk of bleeding, perforation and infection   Further follow-up to be determined after the above evaluation completed           CC:  Allison Reid, PA-C

## 2021-02-16 NOTE — Patient Instructions (Addendum)
If you are age 51 or younger, your body mass index should be between 19-25. Your Body mass index is 30.36 kg/m. If this is out of the aformentioned range listed, please consider follow up with your Primary Care Provider.   PROCEDURES:  You have been scheduled for an endoscopy and colonoscopy. Please follow the written instructions given to you at your visit today. Please pick up your prep supplies at the pharmacy within the next 1-3 days. If you use inhalers (even only as needed), please bring them with you on the day of your procedure.  OVER THE COUNTER MEDICATION Please purchase the following medications over the counter and take as directed:  Omeprazole 20 MG once a day.  It was great seeing you today! Thank you for entrusting me with your care and choosing [facility].  Noralyn Pick, CRNP   Gastroesophageal Reflux Disease, Adult  Gastroesophageal reflux (GER) happens when acid from the stomach flows up into the tube that connects the mouth and the stomach (esophagus). Normally, food travels down the esophagus and stays in the stomach to be digested. With GER, food and stomach acid sometimes move back up into the esophagus. You may have a disease called gastroesophageal reflux disease (GERD) if the reflux:  Happens often.  Causes frequent or very bad symptoms.  Causes problems such as damage to the esophagus. When this happens, the esophagus becomes sore and swollen. Over time, GERD can make small holes (ulcers) in the lining of the esophagus. What are the causes? This condition is caused by a problem with the muscle between the esophagus and the stomach. When this muscle is weak or not normal, it does not close properly to keep food and acid from coming back up from the stomach. The muscle can be weak because of:  Tobacco use.  Pregnancy.  Having a certain type of hernia (hiatal hernia).  Alcohol use.  Certain foods and drinks, such as coffee,  chocolate, onions, and peppermint. What increases the risk?  Being overweight.  Having a disease that affects your connective tissue.  Taking NSAIDs, such a ibuprofen. What are the signs or symptoms?  Heartburn.  Difficult or painful swallowing.  The feeling of having a lump in the throat.  A bitter taste in the mouth.  Bad breath.  Having a lot of saliva.  Having an upset or bloated stomach.  Burping.  Chest pain. Different conditions can cause chest pain. Make sure you see your doctor if you have chest pain.  Shortness of breath or wheezing.  A long-term cough or a cough at night.  Wearing away of the surface of teeth (tooth enamel).  Weight loss. How is this treated?  Making changes to your diet.  Taking medicine.  Having surgery. Treatment will depend on how bad your symptoms are. Follow these instructions at home: Eating and drinking  Follow a diet as told by your doctor. You may need to avoid foods and drinks such as: ? Coffee and tea, with or without caffeine. ? Drinks that contain alcohol. ? Energy drinks and sports drinks. ? Bubbly (carbonated) drinks or sodas. ? Chocolate and cocoa. ? Peppermint and mint flavorings. ? Garlic and onions. ? Horseradish. ? Spicy and acidic foods. These include peppers, chili powder, curry powder, vinegar, hot sauces, and BBQ sauce. ? Citrus fruit juices and citrus fruits, such as oranges, lemons, and limes. ? Tomato-based foods. These include red sauce, chili, salsa, and pizza with red sauce. ? Fried and fatty foods.  These include donuts, french fries, potato chips, and high-fat dressings. ? High-fat meats. These include hot dogs, rib eye steak, sausage, ham, and bacon. ? High-fat dairy items, such as whole milk, butter, and cream cheese.  Eat small meals often. Avoid eating large meals.  Avoid drinking large amounts of liquid with your meals.  Avoid eating meals during the 2-3 hours before bedtime.  Avoid  lying down right after you eat.  Do not exercise right after you eat.   Lifestyle  Do not smoke or use any products that contain nicotine or tobacco. If you need help quitting, ask your doctor.  Try to lower your stress. If you need help doing this, ask your doctor.  If you are overweight, lose an amount of weight that is healthy for you. Ask your doctor about a safe weight loss goal.   General instructions  Pay attention to any changes in your symptoms.  Take over-the-counter and prescription medicines only as told by your doctor.  Do not take aspirin, ibuprofen, or other NSAIDs unless your doctor says it is okay.  Wear loose clothes. Do not wear anything tight around your waist.  Raise (elevate) the head of your bed about 6 inches (15 cm). You may need to use a wedge to do this.  Avoid bending over if this makes your symptoms worse.  Keep all follow-up visits. Contact a doctor if:  You have new symptoms.  You lose weight and you do not know why.  You have trouble swallowing or it hurts to swallow.  You have wheezing or a cough that keeps happening.  You have a hoarse voice.  Your symptoms do not get better with treatment. Get help right away if:  You have sudden pain in your arms, neck, jaw, teeth, or back.  You suddenly feel sweaty, dizzy, or light-headed.  You have chest pain or shortness of breath.  You vomit and the vomit is green, yellow, or black, or it looks like blood or coffee grounds.  You faint.  Your poop (stool) is red, bloody, or black.  You cannot swallow, drink, or eat. These symptoms may represent a serious problem that is an emergency. Do not wait to see if the symptoms will go away. Get medical help right away. Call your local emergency services (911 in the [place] Do not drive yourself to the hospital. Summary  If a person has gastroesophageal reflux disease (GERD), food and stomach acid move back up into the esophagus and cause symptoms or  problems such as damage to the esophagus.  Treatment will depend on how bad your symptoms are.  Follow a diet as told by your doctor.  Take all medicines only as told by your doctor. This information is not intended to replace advice given to you by your health care provider. Make sure you discuss any questions you have with your health care provider. Document Revised: 06/26/2020 Document Reviewed: 06/26/2020 Elsevier Patient Education  [employer] [place].

## 2021-02-19 ENCOUNTER — Telehealth: Payer: Self-pay | Admitting: Physician Assistant

## 2021-02-19 MED ORDER — LISINOPRIL 5 MG PO TABS: 5.0000 mg | ORAL_TABLET | Freq: Every day | ORAL | 0 refills | Status: DC

## 2021-02-19 NOTE — Telephone Encounter (Signed)
Rx sent to pharmacy. AS, CMA 

## 2021-02-19 NOTE — Telephone Encounter (Signed)
Patient needs a refill on Lisinopril and uses [facility] on [address], thanks.

## 2021-02-19 NOTE — Telephone Encounter (Signed)
Left vm letting patient know lisinopril was sent to pharmacy.

## 2021-02-19 NOTE — Addendum Note (Signed)
Addended by: Mickel Crow on: 02/19/2021 11:09 AM   Modules accepted: Orders

## 2021-02-21 NOTE — Progress Notes (Signed)
Agree with the assessment and plan as outlined by Colleen Kennedy-Smith, NP.   Magenta Schmiesing, DO, FACG [facility]   

## 2021-04-18 ENCOUNTER — Ambulatory Visit (AMBULATORY_SURGERY_CENTER): Payer: Managed Care, Other (non HMO) | Admitting: Gastroenterology

## 2021-04-18 ENCOUNTER — Other Ambulatory Visit: Payer: Self-pay

## 2021-04-18 ENCOUNTER — Encounter: Payer: Self-pay | Admitting: Gastroenterology

## 2021-04-18 VITALS — BP 120/79 | HR 74 | Temp 98.0°F | Resp 18 | Ht 66.0 in | Wt 192.0 lb

## 2021-04-18 DIAGNOSIS — K219 Gastro-esophageal reflux disease without esophagitis: Secondary | ICD-10-CM

## 2021-04-18 DIAGNOSIS — K222 Esophageal obstruction: Secondary | ICD-10-CM

## 2021-04-18 DIAGNOSIS — Z1211 Encounter for screening for malignant neoplasm of colon: Secondary | ICD-10-CM

## 2021-04-18 DIAGNOSIS — K635 Polyp of colon: Secondary | ICD-10-CM

## 2021-04-18 DIAGNOSIS — D125 Benign neoplasm of sigmoid colon: Secondary | ICD-10-CM

## 2021-04-18 DIAGNOSIS — K625 Hemorrhage of anus and rectum: Secondary | ICD-10-CM | POA: Diagnosis not present

## 2021-04-18 DIAGNOSIS — R1310 Dysphagia, unspecified: Secondary | ICD-10-CM

## 2021-04-18 DIAGNOSIS — K449 Diaphragmatic hernia without obstruction or gangrene: Secondary | ICD-10-CM | POA: Diagnosis not present

## 2021-04-18 MED ORDER — SODIUM CHLORIDE 0.9 % IV SOLN: 500.0000 mL | Freq: Once | INTRAVENOUS | Status: DC

## 2021-04-18 NOTE — Patient Instructions (Signed)
Thank you for allowing [place] to care for you today!  Await biopsy results, approximately 2 weeks by mail.  Will make recommendations at that time for next colonoscopy.  Resume previous diet and medications today.  Resume your normal daily activities tomorrow.    YOU HAD AN ENDOSCOPIC PROCEDURE TODAY AT [facility]:   Refer to the procedure report that was given to you for any specific questions about what was found during the examination.  If the procedure report does not answer your questions, please call your gastroenterologist to clarify.  If you requested that your care partner not be given the details of your procedure findings, then the procedure report has been included in a sealed envelope for you to review at your convenience later.  YOU SHOULD EXPECT: Some feelings of bloating in the abdomen. Passage of more gas than usual.  Walking can help get rid of the air that was put into your GI tract during the procedure and reduce the bloating. If you had a lower endoscopy (such as a colonoscopy or flexible sigmoidoscopy) you may notice spotting of blood in your stool or on the toilet paper. If you underwent a bowel prep for your procedure, you may not have a normal bowel movement for a few days.  Please Note:  You might notice some irritation and congestion in your nose or some drainage.  This is from the oxygen used during your procedure.  There is no need for concern and it should clear up in a day or so.  SYMPTOMS TO REPORT IMMEDIATELY:   Following lower endoscopy (colonoscopy or flexible sigmoidoscopy):  Excessive amounts of blood in the stool  Significant tenderness or worsening of abdominal pains  Swelling of the abdomen that is new, acute  Fever of 100F or higher   Following upper endoscopy (EGD)  Vomiting of blood or coffee ground material  New chest pain or pain under the shoulder blades  Painful or persistently difficult swallowing  New shortness of  breath  Fever of 100F or higher  Black, tarry-looking stools  For urgent or emergent issues, a gastroenterologist can be reached at any hour by calling [phone]. Do not use MyChart messaging for urgent concerns.    DIET:  We do recommend a small meal at first, but then you may proceed to your regular diet.  Drink plenty of fluids but you should avoid alcoholic beverages for 24 hours.  ACTIVITY:  You should plan to take it easy for the rest of today and you should NOT DRIVE or use heavy machinery until tomorrow (because of the sedation medicines used during the test).    FOLLOW UP: Our staff will call the number listed on your records 48-72 hours following your procedure to check on you and address any questions or concerns that you may have regarding the information given to you following your procedure. If we do not reach you, we will leave a message.  We will attempt to reach you two times.  During this call, we will ask if you have developed any symptoms of COVID 19. If you develop any symptoms (ie: fever, flu-like symptoms, shortness of breath, cough etc.) before then, please call [phone].  If you test positive for Covid 19 in the 2 weeks post procedure, please call and report this information to [place].    If any biopsies were taken you will be contacted by phone or by letter within the next 1-3 weeks.  Please call us at [phone]  [phone] if you have not heard about the biopsies in 3 weeks.    SIGNATURES/CONFIDENTIALITY: You and/or your care partner have signed paperwork which will be entered into your electronic medical record.  These signatures attest to the fact that that the information above on your After Visit Summary has been reviewed and is understood.  Full responsibility of the confidentiality of this discharge information lies with you and/or your care-partner.

## 2021-04-18 NOTE — Progress Notes (Signed)
Called to room to assist during endoscopic procedure.  Patient ID and intended procedure confirmed with present staff. Received instructions for my participation in the procedure from the performing physician.  

## 2021-04-18 NOTE — Op Note (Signed)
[facility] Patient Name: Allison Wallace Procedure Date: 04/18/2021 7:48 AM MRN: [id number] Endoscopist: Gerrit Heck , MD Age: 50 Referring MD:  Date of Birth: 08-31-1971 Gender: Female Account #: [id number] Procedure:                Colonoscopy Indications:              Screening for colorectal malignant neoplasm, This                            is the patient's first colonoscopy Medicines:                Monitored Anesthesia Care Procedure:                Pre-Anesthesia Assessment:                           - Prior to the procedure, a History and Physical                            was performed, and patient medications and                            allergies were reviewed. The patient's tolerance of                            previous anesthesia was also reviewed. The risks                            and benefits of the procedure and the sedation                            options and risks were discussed with the patient.                            All questions were answered, and informed consent                            was obtained. Prior Anticoagulants: The patient has                            taken no previous anticoagulant or antiplatelet                            agents. ASA Grade Assessment: II - A patient with                            mild systemic disease. After reviewing the risks                            and benefits, the patient was deemed in                            satisfactory condition to undergo the procedure.  After obtaining informed consent, the colonoscope                            was passed under direct vision. Throughout the                            procedure, the patient's blood pressure, pulse, and                            oxygen saturations were monitored continuously. The                            Colonoscope was introduced through the anus and                            advanced to the the  cecum, identified by                            appendiceal orifice and ileocecal valve. The                            colonoscopy was performed without difficulty. The                            patient tolerated the procedure well. The quality                            of the bowel preparation was good. The ileocecal                            valve, appendiceal orifice, and rectum were                            photographed. Scope In: 8:27:32 AM Scope Out: 8:52:42 AM Scope Withdrawal Time: 0 hours 17 minutes 15 seconds  Total Procedure Duration: 0 hours 25 minutes 10 seconds  Findings:                 The perianal and digital rectal examinations were                            normal.                           A 3 mm polyp was found in the sigmoid colon. The                            polyp was sessile. The polyp was removed with a                            cold biopsy forceps. Resection and retrieval were                            complete. Estimated blood loss was minimal.  Four sessile polyps were found in the recto-sigmoid                            colon and sigmoid colon. The polyps were 2 to 3 mm                            in size. These polyps were removed with a cold                            biopsy forceps. Resection and retrieval were                            complete. Estimated blood loss was minimal.                           The retroflexed view of the distal rectum and anal                            verge was normal and showed no anal or rectal                            abnormalities. Complications:            No immediate complications. Estimated Blood Loss:     Estimated blood loss was minimal. Impression:               - One 3 mm polyp in the sigmoid colon, removed with                            a cold biopsy forceps. Resected and retrieved.                           - Four 2 to 3 mm polyps at the recto-sigmoid colon                             and in the sigmoid colon, removed with a cold                            biopsy forceps. Resected and retrieved.                           - The distal rectum and anal verge are normal on                            retroflexion view. Recommendation:           - Patient has a contact number available for                            emergencies. The signs and symptoms of potential                            delayed complications were discussed with the  patient. Return to normal activities tomorrow.                            Written discharge instructions were provided to the                            patient.                           - Resume previous diet.                           - Continue present medications.                           - Await pathology results.                           - Repeat colonoscopy for surveillance based on                            pathology results.                           - Return to GI office PRN. Gerrit Heck, MD 04/18/2021 9:04:30 AM

## 2021-04-18 NOTE — Progress Notes (Signed)
Check-in-JB  Vital signs-CW

## 2021-04-18 NOTE — Op Note (Signed)
[place] Patient Name: Allison Wallace Procedure Date: 04/18/2021 7:49 AM MRN: [id number] Endoscopist: Gerrit Heck , MD Age: 50 Referring MD:  Date of Birth: [birth year] Gender: Female Account #: [id number] Procedure:                Upper GI endoscopy Indications:              Dysphagia, Heartburn, Suspected esophageal reflux Medicines:                Monitored Anesthesia Care Procedure:                Pre-Anesthesia Assessment:                           - Prior to the procedure, a History and Physical                            was performed, and patient medications and                            allergies were reviewed. The patient's tolerance of                            previous anesthesia was also reviewed. The risks                            and benefits of the procedure and the sedation                            options and risks were discussed with the patient.                            All questions were answered, and informed consent                            was obtained. Prior Anticoagulants: The patient has                            taken no previous anticoagulant or antiplatelet                            agents. ASA Grade Assessment: II - A patient with                            mild systemic disease. After reviewing the risks                            and benefits, the patient was deemed in                            satisfactory condition to undergo the procedure.                           After obtaining informed consent, the endoscope was  passed under direct vision. Throughout the                            procedure, the patient's blood pressure, pulse, and                            oxygen saturations were monitored continuously. The                            Endoscope was introduced through the mouth, and                            advanced to the second part of duodenum. The upper                             GI endoscopy was accomplished without difficulty.                            The patient tolerated the procedure well. Scope In: Scope Out: Findings:                 One benign-appearing, intrinsic mild stenosis was                            found in the lower third of the esophagus. This                            stenosis measured 1 cm (in length). The stenosis                            was traversed. A TTS dilator was passed through the                            scope. Dilation with a 16-17-18 mm balloon and an                            18-19-20 mm balloon dilator was performed to 20 mm.                            The dilation site was examined and showed no                            bleeding, mucosal tear or perforation. Biopsies                            were taken with a cold forceps for the dual purpose                            of fracturing of the ring and histology. Estimated                            blood loss was minimal.  A 3 cm hiatal hernia was present.                           The upper third of the esophagus and middle third                            of the esophagus were normal. Biopsies were                            obtained from the proximal and distal esophagus                            with cold forceps for histology of suspected                            eosinophilic esophagitis. Estimated blood loss was                            minimal.                           The gastroesophageal flap valve was visualized                            endoscopically and classified as Hill Grade III                            (minimal fold, loose to endoscope, hiatal hernia                            likely).                           The entire examined stomach was normal.                           The examined duodenum was normal. Complications:            No immediate complications. Estimated Blood Loss:     Estimated blood loss was  minimal. Impression:               - Benign-appearing esophageal stenosis. Dilated.                            Biopsied.                           - 3 cm hiatal hernia.                           - Normal upper third of esophagus and middle third                            of esophagus. Biopsied.                           - Gastroesophageal flap valve classified as  Hill                            Grade III (minimal fold, loose to endoscope, hiatal                            hernia likely).                           - Normal stomach.                           - Normal examined duodenum. Recommendation:           - Patient has a contact number available for                            emergencies. The signs and symptoms of potential                            delayed complications were discussed with the                            patient. Return to normal activities tomorrow.                            Written discharge instructions were provided to the                            patient.                           - Resume previous diet.                           - Continue present medications.                           - Await pathology results.                           - Repeat upper endoscopy PRN. Gerrit Heck, MD 04/18/2021 9:01:41 AM

## 2021-04-18 NOTE — Progress Notes (Signed)
A/ox3, pleased with MAC, report to RN 

## 2021-04-20 LAB — HM PAP SMEAR: HM Pap smear: NORMAL

## 2021-04-20 LAB — RESULTS CONSOLE HPV: CHL HPV: NEGATIVE

## 2021-04-20 LAB — HM MAMMOGRAPHY

## 2021-05-03 ENCOUNTER — Encounter: Payer: Self-pay | Admitting: Gastroenterology

## 2021-05-17 ENCOUNTER — Other Ambulatory Visit: Payer: Self-pay

## 2021-05-17 ENCOUNTER — Telehealth: Payer: Self-pay | Admitting: Physician Assistant

## 2021-05-17 ENCOUNTER — Other Ambulatory Visit: Payer: Self-pay | Admitting: Physician Assistant

## 2021-05-17 DIAGNOSIS — I10 Essential (primary) hypertension: Secondary | ICD-10-CM

## 2021-05-17 DIAGNOSIS — E7800 Pure hypercholesterolemia, unspecified: Secondary | ICD-10-CM

## 2021-05-17 MED ORDER — LISINOPRIL 5 MG PO TABS: 5.0000 mg | ORAL_TABLET | Freq: Every day | ORAL | 0 refills | Status: DC

## 2021-05-17 NOTE — Telephone Encounter (Signed)
Patient needs a refill on lisinopril and uses [facility] on [address], thanks.

## 2021-05-17 NOTE — Telephone Encounter (Signed)
Refill has been sent for 90 days to [facility] on [address].  Nolon Bussing, [place]

## 2021-05-17 NOTE — Telephone Encounter (Signed)
Patient scheduled.

## 2021-05-17 NOTE — Telephone Encounter (Signed)
Please contact patient to schedule apt per last AVS for med refills. AS, CMA

## 2021-05-22 ENCOUNTER — Other Ambulatory Visit: Payer: Self-pay

## 2021-05-22 ENCOUNTER — Ambulatory Visit: Payer: Managed Care, Other (non HMO) | Admitting: Physician Assistant

## 2021-05-22 ENCOUNTER — Encounter: Payer: Self-pay | Admitting: Physician Assistant

## 2021-05-22 VITALS — BP 121/82 | HR 78 | Temp 99.1°F | Ht 67.0 in | Wt 195.5 lb

## 2021-05-22 DIAGNOSIS — E7800 Pure hypercholesterolemia, unspecified: Secondary | ICD-10-CM | POA: Diagnosis not present

## 2021-05-22 DIAGNOSIS — K219 Gastro-esophageal reflux disease without esophagitis: Secondary | ICD-10-CM

## 2021-05-22 DIAGNOSIS — I10 Essential (primary) hypertension: Secondary | ICD-10-CM

## 2021-05-22 DIAGNOSIS — R635 Abnormal weight gain: Secondary | ICD-10-CM

## 2021-05-22 DIAGNOSIS — E559 Vitamin D deficiency, unspecified: Secondary | ICD-10-CM

## 2021-05-22 DIAGNOSIS — K429 Umbilical hernia without obstruction or gangrene: Secondary | ICD-10-CM

## 2021-05-22 DIAGNOSIS — R5383 Other fatigue: Secondary | ICD-10-CM | POA: Diagnosis not present

## 2021-05-22 NOTE — Progress Notes (Signed)
Established Patient Office Visit  Subjective:  Patient ID: Allison Wallace, female    DOB: 09-Mar-1971  Age: 50 y.o. MRN: [id number]  CC:  Chief Complaint  Patient presents with  . Hypertension  . Gastroesophageal Reflux    HPI Allison Wallace presents for follow up on hypertension, hyperlipidemia and GERD management. Patient has c/o weight gain, fatigue,and hot flashes. Also reports concerned about umbilical because she does not know how big it is and feels like her right side feels firmer than her left. Denies constipation, n/v or fever. Does report flatulence.   HTN: Pt denies chest pain, palpitations, dizziness or leg swelling. Taking medication as directed without side effects. Does not check BP at home. Pt follows a low salt diet.  HLD: Pt trying to manage with diet. Denies changes with physical activity or fat diet since last visit.  GERD: Takes OTC Prilosec 2-3 times/wk for heartburn symptoms. No specific triggers.     Past Medical History:  Diagnosis Date  . Arthritis    mild lower back   . GERD (gastroesophageal reflux disease)   . Hypertension   . Tobacco use   . Tremor of unknown origin     Past Surgical History:  Procedure Laterality Date  . ECTOPIC PREGNANCY SURGERY      Family History  Problem Relation Age of Onset  . GER disease Mother   . Atrial fibrillation Mother   . Hyperlipidemia Father   . Hepatitis Father   . Liver cancer Father   . Ulcerative colitis Sister   . Diabetes Maternal Grandmother   . Bone cancer Maternal Grandmother   . Skin cancer Maternal Grandfather   . Diabetes Paternal Grandmother   . Heart disease Paternal Grandfather   . Kidney failure Paternal Grandfather   . Colon polyps Maternal Aunt   . Colon cancer Neg Hx   . Esophageal cancer Neg Hx   . Rectal cancer Neg Hx   . Stomach cancer Neg Hx     Social History   Socioeconomic History  . Marital status: Married    Spouse name: Not on file  . Number of  children: 2  . Years of education: Not on file  . Highest education level: Not on file  Occupational History  . Occupation: Photographer  Tobacco Use  . Smoking status: Current Every Day Smoker    Packs/day: 0.50    Types: Cigarettes  . Smokeless tobacco: Never Used  . Tobacco comment: 1 pack every 2 days  Vaping Use  . Vaping Use: Never used  Substance and Sexual Activity  . Alcohol use: Yes    Alcohol/week: 4.0 standard drinks    Types: 4 Glasses of wine per week    Comment: 2-3 per day  . Drug use: No  . Sexual activity: Not on file  Other Topics Concern  . Not on file  Social History Narrative  . Not on file   Social Determinants of Health   Financial Resource Strain: Not on file  Food Insecurity: Not on file  Transportation Needs: Not on file  Physical Activity: Not on file  Stress: Not on file  Social Connections: Not on file  Intimate Partner Violence: Not on file    Outpatient Medications Prior to Visit  Medication Sig Dispense Refill  . calcium elemental as carbonate (BARIATRIC TUMS ULTRA) 400 MG chewable tablet Chew 2,000-3,000 mg by mouth as needed for heartburn.    . Ivermectin 1 % CREA Apply topically.    Marland Kitchen  lisinopril (ZESTRIL) 5 MG tablet Take 1 tablet (5 mg total) by mouth daily. 90 tablet 0  . minocycline (DYNACIN) 100 MG tablet Take 100 mg by mouth 2 (two) times daily.    . Multiple Vitamin (ONE-A-DAY ADULT VITACRAVES+DHA) CHEW Chew by mouth.     No facility-administered medications prior to visit.    No Known Allergies  ROS Review of Systems Review of Systems:  A fourteen system review of systems was performed and found to be positive as per HPI.   Objective:    Physical Exam General:  Well Developed, well nourished, appropriate for stated age.  Neuro:  Alert and oriented,  extra-ocular muscles intact  HEENT:  Normocephalic, atraumatic, neck supple, no carotid bruits appreciated  Skin:  no gross rash, warm, pink. Cardiac:  RRR, S1  S2 Respiratory:  ECTA B/L and A/P, Not using accessory muscles, speaking in full sentences- unlabored. Abdomen: Soft, non-tender, NBS, small umbilical bulge that is easily reducible . Vascular:  Ext warm, no cyanosis apprec.; cap RF less 2 sec. Psych:  No HI/SI, judgement and insight good, Euthymic mood. Full Affect.   BP 121/82   Pulse 78   Temp 99.1 F (37.3 C)   Ht _0  (1.702 m)   Wt 195 lb 8 oz (88.7 kg)   SpO2 98%   BMI 30.62 kg/m  Wt Readings from Last 3 Encounters:  05/22/21 195 lb 8 oz (88.7 kg)  04/18/21 192 lb (87.1 kg)  02/16/21 192 lb 6 oz (87.3 kg)     Health Maintenance Due  Topic Date Due  . MAMMOGRAM  09/30/2020  . Zoster Vaccines- Shingrix (1 of 2) Never done  . COVID-19 Vaccine (3 - Booster for Moderna series) 01/17/2021    There are no preventive care reminders to display for this patient.  Lab Results  Component Value Date   TSH 1.690 05/22/2021   Lab Results  Component Value Date   WBC 6.9 05/22/2021   HGB 15.4 05/22/2021   HCT 45.2 05/22/2021   MCV 107 (H) 05/22/2021   PLT 284 05/22/2021   Lab Results  Component Value Date   NA 139 05/22/2021   K 5.0 05/22/2021   CO2 17 (L) 05/22/2021   GLUCOSE 75 05/22/2021   BUN 13 05/22/2021   CREATININE 0.75 05/22/2021   BILITOT <0.2 05/22/2021   ALKPHOS 110 05/22/2021   AST 20 05/22/2021   ALT 19 05/22/2021   PROT 6.4 05/22/2021   ALBUMIN 4.2 05/22/2021   CALCIUM 9.0 05/22/2021   EGFR 97 05/22/2021   Lab Results  Component Value Date   CHOL 233 (H) 10/31/2020   Lab Results  Component Value Date   HDL 55 10/31/2020   Lab Results  Component Value Date   LDLCALC 154 (H) 10/31/2020   Lab Results  Component Value Date   TRIG 134 10/31/2020   Lab Results  Component Value Date   CHOLHDL 4.2 10/31/2020   Lab Results  Component Value Date   HGBA1C 4.9 10/31/2020      Assessment & Plan:   Problem List Items Addressed This Visit      Cardiovascular and Mediastinum   HTN,  goal below 130/80 - Primary   Relevant Orders   Comp Met (CMET) (Completed)     Digestive   GERD (gastroesophageal reflux disease)     Other   Fatigue   Relevant Orders   Comp Met (CMET) (Completed)   CBC w/Diff (Completed)   TSH (Completed)   Vitamin  D (25 hydroxy) (Completed)   Elevated LDL cholesterol level    Other Visit Diagnoses    Weight gain       Relevant Orders   TSH (Completed)   Umbilical hernia without obstruction and without gangrene       Relevant Orders   US Abdomen Limited   Vitamin D deficiency       Relevant Medications   Vitamin D, Ergocalciferol, (DRISDOL) 1.25 MG (50000 UNIT) CAPS capsule     Hypertension: -Fairly controlled. -Continue current medication regimen. Will collect CMP for medication monitoring. -Recommend to continue low-sodium diet and stay well-hydrated. -Will continue to monitor.  Elevated LDL cholesterol level: -Last lipid panel: total cholesterol 233, triglycerides 134, HDL 55, LDL 154 -The 10-year ASCVD risk score Mikey Bussing DC Jr., et al., 2013) is: 5.2% -Recommend to follow a diet low in saturated and trans fats and increase aerobic exercise. -Will repeat lipid panel with CPE and recommend to consider statin therapy if LDL fails to improve.  GERD: -Stable. -Recommend to avoid provocative food triggers such as spicy foods, etoh, caffeine etc. -Will continue to monitor.  Umbilical hernia without obstruction and without gangrene: -Reassurance provided, umbilical hernia small and easily reducible. Due to patient concerns will place order for abd [place] for further evaluation. -Discussed flatulence likely contributing to abdominal symptoms  vs hernia.   Fatigue, weight gain: -Will place lab orders to evaluate for potential etiologies such as anemia, thyroid disease or vitamin deficiency.  -Recommend to increase physical activity.     Meds ordered this encounter  Medications  . Vitamin D, Ergocalciferol, (DRISDOL) 1.25 MG (50000 UNIT)  CAPS capsule    Sig: Take 1 capsule (50,000 Units total) by mouth every 7 (seven) days.    Dispense:  12 capsule    Refill:  0    Follow-up: Return in about 6 months (around 11/22/2021) for CPE and FBW few days prior .   Note:  This note was prepared with assistance of Dragon voice recognition software. Occasional wrong-word or sound-a-like substitutions may have occurred due to the inherent limitations of voice recognition software.  Lorrene Reid, PA-C

## 2021-05-22 NOTE — Patient Instructions (Signed)
Umbilical Hernia, Adult  A hernia is a bulge of tissue that pushes through an opening between muscles. An umbilical hernia happens in the abdomen, near the belly button (umbilicus). The hernia may contain tissues from the small intestine, large intestine, or fatty tissue covering the intestines (omentum). Umbilical hernias in adults tend to get worse over time, and they require surgical treatment. There are several types of umbilical hernias. You may have:  A hernia located just above or below the umbilicus (indirect hernia). This is the most common type of umbilical hernia in adults.  A hernia that forms through an opening formed by the umbilicus (direct hernia).  A hernia that comes and goes (reducible hernia). A reducible hernia may be visible only when you strain, lift something heavy, or cough. This type of hernia can be pushed back into the abdomen (reduced).  A hernia that traps abdominal tissue inside the hernia (incarcerated hernia). This type of hernia cannot be reduced.  A hernia that cuts off blood flow to the tissues inside the hernia (strangulated hernia). The tissues can start to die if this happens. This type of hernia requires emergency treatment. What are the causes? An umbilical hernia happens when tissue inside the abdomen presses on a weak area of the abdominal muscles. What increases the risk? You may have a greater risk of this condition if you:  Are obese.  Have had several pregnancies.  Have a buildup of fluid inside your abdomen (ascites).  Have had surgery that weakens the abdominal muscles. What are the signs or symptoms? The main symptom of this condition is a painless bulge at or near the belly button. A reducible hernia may be visible only when you strain, lift something heavy, or cough. Other symptoms may include:  Dull pain.  A feeling of pressure. Symptoms of a strangulated hernia may include:  Pain that gets increasingly worse.  Nausea and  vomiting.  Pain when pressing on the hernia.  Skin over the hernia becoming red or purple.  Constipation.  Blood in the stool. How is this diagnosed? This condition may be diagnosed based on:  A physical exam. You may be asked to cough or strain while standing. These actions increase the pressure inside your abdomen and force the hernia through the opening in your muscles. Your health care provider may try to reduce the hernia by pressing on it.  Your symptoms and medical history. How is this treated? Surgery is the only treatment for an umbilical hernia. Surgery for a strangulated hernia is done as soon as possible. If you have a small hernia that is not incarcerated, you may need to lose weight before having surgery. Follow these instructions at home:  Lose weight, if told by your health care provider.  Do not try to push the hernia back in.  Watch your hernia for any changes in color or size. Tell your health care provider if any changes occur.  You may need to avoid activities that increase pressure on your hernia.  Do not lift anything that is heavier than 10 lb (4.5 kg) until your health care provider says that this is safe.  Take over-the-counter and prescription medicines only as told by your health care provider.  Keep all follow-up visits as told by your health care provider. This is important. Contact a health care provider if:  Your hernia gets larger.  Your hernia becomes painful. Get help right away if:  You develop sudden, severe pain near the area of your hernia.    You have pain as well as nausea or vomiting.  You have pain and the skin over your hernia changes color.  You develop a fever. This information is not intended to replace advice given to you by your health care provider. Make sure you discuss any questions you have with your health care provider. Document Revised: 01/28/2018 Document Reviewed: 06/16/2017 Elsevier Patient Education  [employer].

## 2021-05-23 LAB — CBC WITH DIFFERENTIAL/PLATELET
Basophils Absolute: 0 10*3/uL (ref 0.0–0.2)
Basos: 0 %
EOS (ABSOLUTE): 0.1 10*3/uL (ref 0.0–0.4)
Eos: 2 %
Hematocrit: 45.2 % (ref 34.0–46.6)
Hemoglobin: 15.4 g/dL (ref 11.1–15.9)
Immature Grans (Abs): 0 10*3/uL (ref 0.0–0.1)
Immature Granulocytes: 1 %
Lymphocytes Absolute: 2 10*3/uL (ref 0.7–3.1)
Lymphs: 28 %
MCH: 36.5 pg — ABNORMAL HIGH (ref 26.6–33.0)
MCHC: 34.1 g/dL (ref 31.5–35.7)
MCV: 107 fL — ABNORMAL HIGH (ref 79–97)
Monocytes Absolute: 0.6 10*3/uL (ref 0.1–0.9)
Monocytes: 8 %
Neutrophils Absolute: 4.2 10*3/uL (ref 1.4–7.0)
Neutrophils: 61 %
Platelets: 284 10*3/uL (ref 150–450)
RBC: 4.22 x10E6/uL (ref 3.77–5.28)
RDW: 12.4 % (ref 11.7–15.4)
WBC: 6.9 10*3/uL (ref 3.4–10.8)

## 2021-05-23 LAB — TSH: TSH: 1.69 u[IU]/mL (ref 0.450–4.500)

## 2021-05-23 LAB — COMPREHENSIVE METABOLIC PANEL
ALT: 19 IU/L (ref 0–32)
AST: 20 IU/L (ref 0–40)
Albumin/Globulin Ratio: 1.9 (ref 1.2–2.2)
Albumin: 4.2 g/dL (ref 3.8–4.8)
Alkaline Phosphatase: 110 IU/L (ref 44–121)
BUN/Creatinine Ratio: 17 (ref 9–23)
BUN: 13 mg/dL (ref 6–24)
Bilirubin Total: <0.2 mg/dL (ref 0.0–1.2)
CO2: 17 mmol/L — ABNORMAL LOW (ref 20–29)
Calcium: 9 mg/dL (ref 8.7–10.2)
Chloride: 104 mmol/L (ref 96–106)
Creatinine, Ser: 0.75 mg/dL (ref 0.57–1.00)
Globulin, Total: 2.2 g/dL (ref 1.5–4.5)
Glucose: 75 mg/dL (ref 65–99)
Potassium: 5 mmol/L (ref 3.5–5.2)
Sodium: 139 mmol/L (ref 134–144)
Total Protein: 6.4 g/dL (ref 6.0–8.5)
eGFR: 97 mL/min/{1.73_m2} (ref >59)

## 2021-05-23 LAB — VITAMIN D 25 HYDROXY (VIT D DEFICIENCY, FRACTURES): Vit D, 25-Hydroxy: 18.7 ng/mL — ABNORMAL LOW (ref 30.0–100.0)

## 2021-05-23 MED ORDER — VITAMIN D (ERGOCALCIFEROL) 1.25 MG (50000 UNIT) PO CAPS: 50000.0000 [IU] | ORAL_CAPSULE | ORAL | 0 refills | Status: DC

## 2021-06-10 ENCOUNTER — Other Ambulatory Visit: Payer: Self-pay | Admitting: Physician Assistant

## 2021-06-10 DIAGNOSIS — E7800 Pure hypercholesterolemia, unspecified: Secondary | ICD-10-CM

## 2021-06-10 DIAGNOSIS — I10 Essential (primary) hypertension: Secondary | ICD-10-CM

## 2021-06-22 ENCOUNTER — Ambulatory Visit
Admission: RE | Admit: 2021-06-22 | Discharge: 2021-06-22 | Disposition: A | Discharge status: Home / Self Care | Payer: Managed Care, Other (non HMO) | Source: Ambulatory Visit | Attending: Physician Assistant | Admitting: Physician Assistant

## 2021-06-22 DIAGNOSIS — K429 Umbilical hernia without obstruction or gangrene: Secondary | ICD-10-CM

## 2021-08-22 ENCOUNTER — Other Ambulatory Visit: Payer: Self-pay | Admitting: Physician Assistant

## 2021-08-22 DIAGNOSIS — E559 Vitamin D deficiency, unspecified: Secondary | ICD-10-CM

## 2021-11-16 ENCOUNTER — Other Ambulatory Visit: Payer: Self-pay

## 2021-11-16 DIAGNOSIS — I10 Essential (primary) hypertension: Secondary | ICD-10-CM

## 2021-11-16 DIAGNOSIS — R635 Abnormal weight gain: Secondary | ICD-10-CM

## 2021-11-16 DIAGNOSIS — R5383 Other fatigue: Secondary | ICD-10-CM

## 2021-11-16 DIAGNOSIS — Z0000 Encounter for general adult medical examination without abnormal findings: Secondary | ICD-10-CM

## 2021-11-19 ENCOUNTER — Other Ambulatory Visit: Payer: Self-pay

## 2021-11-19 ENCOUNTER — Other Ambulatory Visit: Payer: Managed Care, Other (non HMO)

## 2021-11-19 DIAGNOSIS — I10 Essential (primary) hypertension: Secondary | ICD-10-CM

## 2021-11-19 DIAGNOSIS — Z0000 Encounter for general adult medical examination without abnormal findings: Secondary | ICD-10-CM

## 2021-11-19 DIAGNOSIS — R5383 Other fatigue: Secondary | ICD-10-CM

## 2021-11-19 DIAGNOSIS — R635 Abnormal weight gain: Secondary | ICD-10-CM

## 2021-11-20 LAB — CBC WITH DIFFERENTIAL/PLATELET
Basophils Absolute: 0 10*3/uL (ref 0.0–0.2)
Basos: 0 %
EOS (ABSOLUTE): 0.1 10*3/uL (ref 0.0–0.4)
Eos: 1 %
Hematocrit: 46.7 % — ABNORMAL HIGH (ref 34.0–46.6)
Hemoglobin: 16 g/dL — ABNORMAL HIGH (ref 11.1–15.9)
Immature Grans (Abs): 0 10*3/uL (ref 0.0–0.1)
Immature Granulocytes: 1 %
Lymphocytes Absolute: 1.9 10*3/uL (ref 0.7–3.1)
Lymphs: 24 %
MCH: 36.6 pg — ABNORMAL HIGH (ref 26.6–33.0)
MCHC: 34.3 g/dL (ref 31.5–35.7)
MCV: 107 fL — ABNORMAL HIGH (ref 79–97)
Monocytes Absolute: 0.7 10*3/uL (ref 0.1–0.9)
Monocytes: 9 %
Neutrophils Absolute: 5.1 10*3/uL (ref 1.4–7.0)
Neutrophils: 65 %
Platelets: 284 10*3/uL (ref 150–450)
RBC: 4.37 x10E6/uL (ref 3.77–5.28)
RDW: 11.9 % (ref 11.7–15.4)
WBC: 7.8 10*3/uL (ref 3.4–10.8)

## 2021-11-20 LAB — COMPREHENSIVE METABOLIC PANEL
ALT: 37 IU/L — ABNORMAL HIGH (ref 0–32)
AST: 19 IU/L (ref 0–40)
Albumin/Globulin Ratio: 2 (ref 1.2–2.2)
Albumin: 4.2 g/dL (ref 3.8–4.9)
Alkaline Phosphatase: 118 IU/L (ref 44–121)
BUN/Creatinine Ratio: 10 (ref 9–23)
BUN: 7 mg/dL (ref 6–24)
Bilirubin Total: 0.5 mg/dL (ref 0.0–1.2)
CO2: 24 mmol/L (ref 20–29)
Calcium: 8.9 mg/dL (ref 8.7–10.2)
Chloride: 103 mmol/L (ref 96–106)
Creatinine, Ser: 0.71 mg/dL (ref 0.57–1.00)
Globulin, Total: 2.1 g/dL (ref 1.5–4.5)
Glucose: 92 mg/dL (ref 70–99)
Potassium: 4.5 mmol/L (ref 3.5–5.2)
Sodium: 139 mmol/L (ref 134–144)
Total Protein: 6.3 g/dL (ref 6.0–8.5)
eGFR: 103 mL/min/{1.73_m2} (ref >59)

## 2021-11-20 LAB — LIPID PANEL
Chol/HDL Ratio: 4.6 ratio — ABNORMAL HIGH (ref 0.0–4.4)
Cholesterol, Total: 219 mg/dL — ABNORMAL HIGH (ref 100–199)
HDL: 48 mg/dL (ref >39)
LDL Chol Calc (NIH): 137 mg/dL — ABNORMAL HIGH (ref 0–99)
Triglycerides: 188 mg/dL — ABNORMAL HIGH (ref 0–149)
VLDL Cholesterol Cal: 34 mg/dL (ref 5–40)

## 2021-11-20 LAB — HEMOGLOBIN A1C
Est. average glucose Bld gHb Est-mCnc: 94 mg/dL
Hgb A1c MFr Bld: 4.9 % (ref 4.8–5.6)

## 2021-11-20 LAB — TSH: TSH: 2.37 u[IU]/mL (ref 0.450–4.500)

## 2021-11-21 ENCOUNTER — Encounter: Payer: Self-pay | Admitting: Physician Assistant

## 2021-11-21 ENCOUNTER — Other Ambulatory Visit: Payer: Self-pay

## 2021-11-21 ENCOUNTER — Ambulatory Visit (INDEPENDENT_AMBULATORY_CARE_PROVIDER_SITE_OTHER): Payer: Managed Care, Other (non HMO) | Admitting: Physician Assistant

## 2021-11-21 VITALS — BP 133/84 | HR 82 | Temp 97.6°F | Ht 67.0 in | Wt 201.2 lb

## 2021-11-21 DIAGNOSIS — Z23 Encounter for immunization: Secondary | ICD-10-CM

## 2021-11-21 DIAGNOSIS — L719 Rosacea, unspecified: Secondary | ICD-10-CM

## 2021-11-21 DIAGNOSIS — R0781 Pleurodynia: Secondary | ICD-10-CM | POA: Diagnosis not present

## 2021-11-21 DIAGNOSIS — R222 Localized swelling, mass and lump, trunk: Secondary | ICD-10-CM

## 2021-11-21 DIAGNOSIS — Z0000 Encounter for general adult medical examination without abnormal findings: Secondary | ICD-10-CM

## 2021-11-21 MED ORDER — MINOCYCLINE HCL 100 MG PO TABS: 100.0000 mg | ORAL_TABLET | Freq: Two times a day (BID) | ORAL | 0 refills | Status: DC

## 2021-11-21 NOTE — Patient Instructions (Signed)
Preventive Care 92-51 Years Old, Female Preventive care refers to lifestyle choices and visits with your health care provider that can promote health and wellness. Preventive care visits are also called wellness exams. What can I expect for my preventive care visit? Counseling Your health care provider may ask you questions about your: Medical history, including: Past medical problems. Family medical history. Pregnancy history. Current health, including: Menstrual cycle. Method of birth control. Emotional well-being. Home life and relationship well-being. Sexual activity and sexual health. Lifestyle, including: Alcohol, nicotine or tobacco, and drug use. Access to firearms. Diet, exercise, and sleep habits. Work and work Statistician. Sunscreen use. Safety issues such as seatbelt and bike helmet use. Physical exam Your health care provider will check your: Height and weight. These may be used to calculate your BMI (body mass index). BMI is a measurement that tells if you are at a healthy weight. Waist circumference. This measures the distance around your waistline. This measurement also tells if you are at a healthy weight and may help predict your risk of certain diseases, such as type 2 diabetes and high blood pressure. Heart rate and blood pressure. Body temperature. Skin for abnormal spots. What immunizations do I need? Vaccines are usually given at various ages, according to a schedule. Your health care provider will recommend vaccines for you based on your age, medical history, and lifestyle or other factors, such as travel or where you work. What tests do I need? Screening Your health care provider may recommend screening tests for certain conditions. This may include: Lipid and cholesterol levels. Diabetes screening. This is done by checking your blood sugar (glucose) after you have not eaten for a while (fasting). Pelvic exam and Pap test. Hepatitis B test. Hepatitis C  test. HIV (human immunodeficiency virus) test. STI (sexually transmitted infection) testing, if you are at risk. Lung cancer screening. Colorectal cancer screening. Mammogram. Talk with your health care provider about when you should start having regular mammograms. This may depend on whether you have a family history of breast cancer. BRCA-related cancer screening. This may be done if you have a family history of breast, ovarian, tubal, or peritoneal cancers. Bone density scan. This is done to screen for osteoporosis. Talk with your health care provider about your test results, treatment options, and if necessary, the need for more tests. Follow these instructions at home: Eating and drinking  Eat a diet that includes fresh fruits and vegetables, whole grains, lean protein, and low-fat dairy products. Take vitamin and mineral supplements as recommended by your health care provider. Do not drink alcohol if: Your health care provider tells you not to drink. You are pregnant, may be pregnant, or are planning to become pregnant. If you drink alcohol: Limit how much you have to 0-1 drink a day. Know how much alcohol is in your drink. In the [place] one drink equals one 12 oz bottle of beer (355 mL), one 5 oz glass of wine (148 mL), or one 1 oz glass of hard liquor (44 mL). Lifestyle Brush your teeth every morning and night with fluoride toothpaste. Floss one time each day. Exercise for at least 30 minutes 5 or more days each week. Do not use any products that contain nicotine or tobacco. These products include cigarettes, chewing tobacco, and vaping devices, such as e-cigarettes. If you need help quitting, ask your health care provider. Do not use drugs. If you are sexually active, practice safe sex. Use a condom or other form of protection to prevent  STIs. If you do not wish to become pregnant, use a form of birth control. If you plan to become pregnant, see your health care provider for a  prepregnancy visit. Take aspirin only as told by your health care provider. Make sure that you understand how much to take and what form to take. Work with your health care provider to find out whether it is safe and beneficial for you to take aspirin daily. Find healthy ways to manage stress, such as: Meditation, yoga, or listening to music. Journaling. Talking to a trusted person. Spending time with friends and family. Minimize exposure to UV radiation to reduce your risk of skin cancer. Safety Always wear your seat belt while driving or riding in a vehicle. Do not drive: If you have been drinking alcohol. Do not ride with someone who has been drinking. When you are tired or distracted. While texting. If you have been using any mind-altering substances or drugs. Wear a helmet and other protective equipment during sports activities. If you have firearms in your house, make sure you follow all gun safety procedures. Seek help if you have been physically or sexually abused. What's next? Visit your health care provider once a year for an annual wellness visit. Ask your health care provider how often you should have your eyes and teeth checked. Stay up to date on all vaccines. This information is not intended to replace advice given to you by your health care provider. Make sure you discuss any questions you have with your health care provider. Document Revised: 06/13/2021 Document Reviewed: 06/13/2021 Elsevier Patient Education  [employer].

## 2021-11-21 NOTE — Progress Notes (Signed)
Subjective:     Allison Wallace is a 50 y.o. female and is here for a comprehensive physical exam. The patient reports  feels a lump or mass at right lower ribcage which has been ongoing for several months .    Social History   Socioeconomic History   Marital status: Married    Spouse name: Not on file   Number of children: 2   Years of education: Not on file   Highest education level: Not on file  Occupational History   Occupation: Photographer  Tobacco Use   Smoking status: Every Day    Packs/day: 0.50    Types: Cigarettes   Smokeless tobacco: Never   Tobacco comments:    1 pack every 2 days  Vaping Use   Vaping Use: Never used  Substance and Sexual Activity   Alcohol use: Yes    Alcohol/week: 4.0 standard drinks    Types: 4 Glasses of wine per week    Comment: 2-3 per day   Drug use: No   Sexual activity: Not on file  Other Topics Concern   Not on file  Social History Narrative   Not on file   Social Determinants of Health   Financial Resource Strain: Not on file  Food Insecurity: Not on file  Transportation Needs: Not on file  Physical Activity: Not on file  Stress: Not on file  Social Connections: Not on file  Intimate Partner Violence: Not on file   Health Maintenance  Topic Date Due   Pneumococcal Vaccine 50-47 Years old (1 - PCV) Never done   MAMMOGRAM  09/30/2020   Zoster Vaccines- Shingrix (1 of 2) Never done   COVID-19 Vaccine (3 - Booster for Moderna series) 10/12/2020   INFLUENZA VACCINE  07/30/2021   PAP SMEAR-Modifier  08/26/2021   TETANUS/TDAP  07/06/2028   COLONOSCOPY (Pts 45-34yrs Insurance coverage will need to be confirmed)  04/19/2031   Hepatitis C Screening  Completed   HIV Screening  Completed   HPV VACCINES  Aged Out    The following portions of the patient's history were reviewed and updated as appropriate: allergies, current medications, past family history, past medical history, past social history, past surgical  history, and problem list.  Review of Systems Pertinent items noted in HPI and remainder of comprehensive ROS otherwise negative.   Objective:    BP 133/84   Pulse 82   Temp 97.6 F (36.4 C)   Ht 5\' 7"  (1.702 m)   Wt 201 lb 3.2 oz (91.3 kg)   SpO2 99%   BMI 31.51 kg/m  General appearance: alert, cooperative, and no distress Head: Normocephalic, without obvious abnormality, atraumatic Eyes: conjunctivae/corneas clear. PERRL, EOM's intact. Fundi benign. Ears: normal TM's and external ear canals both ears Nose: Nares normal. Septum midline. Mucosa normal. No drainage or sinus tenderness. Throat: lips, mucosa, and tongue normal; teeth and gums normal Neck: no adenopathy, no JVD, supple, symmetrical, trachea midline, and thyroid: normal to inspection and palpation Back: symmetric, no curvature. ROM normal. No CVA tenderness. Lungs: clear to auscultation bilaterally Chest: Normal excursion, tenderness to palpation at right lower ribcage between ribs 8-10 with possible firm mass within intercostal region Heart: regular rate and rhythm and S1, S2 normal Abdomen: normal findings: bowel sounds normal, no organomegaly, and soft, non-tender and abnormal findings:  umbilical hernia Extremities: extremities normal, atraumatic, no cyanosis or edema Pulses: 2+ and symmetric Skin: mobility and turgor normal, no edema, and no evidence of bleeding or bruising  or scaly patch right lower chin Lymph nodes: Cervical adenopathy: normal and Supraclavicular adenopathy: normal Neurologic: Grossly normal    Assessment:    Healthy female exam.     Plan:  -Discussed most recent lab results which are essentially stable or within normal limits with the exception of lipid panel which remains elevated. Discussed with patient management options and prefers to work on dietary and lifestyle changes before considering medication therapy. Discussed low fat diet and increasing physical activity- 150 mins/wk moderate  aerobic exercise. -Patient denies prior hematology evaluation for elevated hemoglobin/hematocrit, will continue to monitor. If remains elevated recommend further evaluation. -Will request records (pap, mammogram) from [facility]. -Agreeable to influenza vaccine. Deferred Shingrix vaccine. -Discussed reducing alcohol use to 1 drink per day/max 7 drinks per week and tobacco use. -Will place order for chest -xray to evaluate for bony abnormality of ribcage or signs/evidence of mass before proceeding with additional imaging if indicated. -Encourage to continue weight loss efforts with dietary changes and exercise. Discussed contacting her insurance to inquire coverage for medication therapy for weight loss such as [place] or [id number]. Will further address at follow up visit. -Will provide one time refill of minocycline for rosacea until seen by dermatologist. -Follow up in 4 months for HLD, Wt and FBW (lipid panel, cmp, cbc w/d)  See After Visit Summary for Counseling Recommendations

## 2021-11-30 ENCOUNTER — Other Ambulatory Visit: Payer: Self-pay | Admitting: Physician Assistant

## 2021-11-30 DIAGNOSIS — I10 Essential (primary) hypertension: Secondary | ICD-10-CM

## 2021-11-30 DIAGNOSIS — E7800 Pure hypercholesterolemia, unspecified: Secondary | ICD-10-CM

## 2021-12-04 ENCOUNTER — Encounter: Payer: Self-pay | Admitting: Physician Assistant

## 2021-12-10 ENCOUNTER — Encounter: Payer: Self-pay | Admitting: Physician Assistant

## 2021-12-12 ENCOUNTER — Ambulatory Visit: Payer: Managed Care, Other (non HMO) | Admitting: Physician Assistant

## 2021-12-12 ENCOUNTER — Encounter: Payer: Self-pay | Admitting: Physician Assistant

## 2021-12-12 ENCOUNTER — Other Ambulatory Visit: Payer: Self-pay

## 2021-12-12 VITALS — BP 128/85 | HR 88 | Temp 97.9°F | Ht 67.0 in | Wt 199.0 lb

## 2021-12-12 DIAGNOSIS — E669 Obesity, unspecified: Secondary | ICD-10-CM

## 2021-12-12 DIAGNOSIS — Z6831 Body mass index (BMI) 31.0-31.9, adult: Secondary | ICD-10-CM

## 2021-12-12 MED ORDER — WEGOVY 0.25 MG/0.5ML ~~LOC~~ SOAJ: 0.2500 mg | SUBCUTANEOUS | 0 refills | Status: DC

## 2021-12-12 NOTE — Patient Instructions (Signed)

## 2021-12-12 NOTE — Progress Notes (Signed)
Established Patient Office Visit  Subjective:  Patient ID: Allison Wallace, female    DOB: 09-14-1971  Age: 49 y.o. MRN: [id number]  CC:  Chief Complaint  Patient presents with   Follow-up    Weight Loss Medication    HPI Allison Wallace presents for discussion on weight management. Patient reports wants to start on a medication to help with weight loss. Has been struggling with losing weight. Has worked on  being more active and changing her diet which has been inconsistent. Has not been on weight loss medication before.  Past Medical History:  Diagnosis Date   Arthritis    mild lower back    GERD (gastroesophageal reflux disease)    Hypertension    Tobacco use    Tremor of unknown origin     Past Surgical History:  Procedure Laterality Date   ECTOPIC PREGNANCY SURGERY      Family History  Problem Relation Age of Onset   GER disease Mother    Atrial fibrillation Mother    Hyperlipidemia Father    Hepatitis Father    Liver cancer Father    Ulcerative colitis Sister    Diabetes Maternal Grandmother    Bone cancer Maternal Grandmother    Skin cancer Maternal Grandfather    Diabetes Paternal Grandmother    Heart disease Paternal Grandfather    Kidney failure Paternal Grandfather    Colon polyps Maternal Aunt    Colon cancer Neg Hx    Esophageal cancer Neg Hx    Rectal cancer Neg Hx    Stomach cancer Neg Hx     Social History   Socioeconomic History   Marital status: Married    Spouse name: Not on file   Number of children: 2   Years of education: Not on file   Highest education level: Not on file  Occupational History   Occupation: Photographer  Tobacco Use   Smoking status: Every Day    Packs/day: 0.50    Types: Cigarettes   Smokeless tobacco: Never   Tobacco comments:    1 pack every 2 days  Vaping Use   Vaping Use: Never used  Substance and Sexual Activity   Alcohol use: Yes    Alcohol/week: 4.0 standard drinks    Types: 4  Glasses of wine per week    Comment: 2-3 per day   Drug use: No   Sexual activity: Not on file  Other Topics Concern   Not on file  Social History Narrative   Not on file   Social Determinants of Health   Financial Resource Strain: Not on file  Food Insecurity: Not on file  Transportation Needs: Not on file  Physical Activity: Not on file  Stress: Not on file  Social Connections: Not on file  Intimate Partner Violence: Not on file    Outpatient Medications Prior to Visit  Medication Sig Dispense Refill   calcium elemental as carbonate (BARIATRIC TUMS ULTRA) 400 MG chewable tablet Chew 2,000-3,000 mg by mouth as needed for heartburn.     lisinopril (ZESTRIL) 5 MG tablet TAKE 1 TABLET (5 MG TOTAL) BY MOUTH DAILY. 90 tablet 0   minocycline (DYNACIN) 100 MG tablet Take 1 tablet (100 mg total) by mouth 2 (two) times daily. 14 tablet 0   Multiple Vitamin (ONE-A-DAY ADULT VITACRAVES+DHA) CHEW Chew by mouth.     Vitamin D, Ergocalciferol, (DRISDOL) 1.25 MG (50000 UNIT) CAPS capsule TAKE 1 CAPSULE (50,000 UNITS TOTAL) BY MOUTH EVERY 7 (  SEVEN) DAYS 12 capsule 0   No facility-administered medications prior to visit.    No Known Allergies  ROS Review of Systems Review of Systems:  A fourteen system review of systems was performed and found to be positive as per HPI.  Objective:    Physical Exam General:  Well Developed, well nourished, appropriate for stated age.  Neuro:  Alert and oriented,  extra-ocular muscles intact  HEENT:  Normocephalic, atraumatic, neck supple  Skin:  no gross rash, warm, pink. Cardiac:  RRR, S1 S2 Respiratory: CTA B/L, Not using accessory muscles, speaking in full sentences- unlabored. Vascular:  Ext warm, no cyanosis apprec.; cap RF less 2 sec. Psych:  No HI/SI, judgement and insight good, Euthymic mood. Full Affect.  BP 128/85    Pulse 88    Temp 97.9 F (36.6 C)    Ht '5\' 7"'  (1.702 m)    Wt 199 lb (90.3 kg)    SpO2 98%    BMI 31.17 kg/m  Wt Readings  from Last 3 Encounters:  12/12/21 199 lb (90.3 kg)  11/21/21 201 lb 3.2 oz (91.3 kg)  05/22/21 195 lb 8 oz (88.7 kg)     Health Maintenance Due  Topic Date Due   Pneumococcal Vaccine 50-35 Years old (1 - PCV) Never done   Zoster Vaccines- Shingrix (1 of 2) Never done   COVID-19 Vaccine (3 - Booster for Moderna series) 10/12/2020    There are no preventive care reminders to display for this patient.  Lab Results  Component Value Date   TSH 2.370 11/19/2021   Lab Results  Component Value Date   WBC 7.8 11/19/2021   HGB 16.0 (H) 11/19/2021   HCT 46.7 (H) 11/19/2021   MCV 107 (H) 11/19/2021   PLT 284 11/19/2021   Lab Results  Component Value Date   NA 139 11/19/2021   K 4.5 11/19/2021   CO2 24 11/19/2021   GLUCOSE 92 11/19/2021   BUN 7 11/19/2021   CREATININE 0.71 11/19/2021   BILITOT 0.5 11/19/2021   ALKPHOS 118 11/19/2021   AST 19 11/19/2021   ALT 37 (H) 11/19/2021   PROT 6.3 11/19/2021   ALBUMIN 4.2 11/19/2021   CALCIUM 8.9 11/19/2021   EGFR 103 11/19/2021   Lab Results  Component Value Date   CHOL 219 (H) 11/19/2021   Lab Results  Component Value Date   HDL 48 11/19/2021   Lab Results  Component Value Date   LDLCALC 137 (H) 11/19/2021   Lab Results  Component Value Date   TRIG 188 (H) 11/19/2021   Lab Results  Component Value Date   CHOLHDL 4.6 (H) 11/19/2021   Lab Results  Component Value Date   HGBA1C 4.9 11/19/2021      Assessment & Plan:   Problem List Items Addressed This Visit   None Visit Diagnoses     Class 1 obesity with serious comorbidity and body mass index (BMI) of 31.0 to 31.9 in adult, unspecified obesity type    -  Primary   Relevant Medications   Semaglutide-Weight Management (WEGOVY) 0.25 MG/0.5ML SOAJ      Class I obesity with serious comorbidity and body mass index (BMI) of 31.0 to 31.9 in adult, unspecified obesity type: -Associated with hypertension and hyperlipidemia. -Patient has gained 15 pounds since  11/03/2020. Patient has risk factors for cardiovascular disease and weight loss would provide benefits and reduce risk.  -Discussed management options and will start medication therapy with Wegovy in conjunction with dietary and  lifestyle changes. Discussed potential side effects. No personal or family hx of MEN or MTC. No prior hx of pancreatitis.  -Follow up in 4 weeks for weight management. Advised can schedule f/up visit once she starts medication.    Meds ordered this encounter  Medications   Semaglutide-Weight Management (WEGOVY) 0.25 MG/0.5ML SOAJ    Sig: Inject 0.25 mg into the skin once a week.    Dispense:  2 mL    Refill:  0    Order Specific Question:   Supervising Provider    Answer:   Beatrice Lecher D [[id number]]    Follow-up: Return in about 4 weeks (around 01/09/2022) for Weight .   Note:  This note was prepared with assistance of Dragon voice recognition software. Occasional wrong-word or sound-a-like substitutions may have occurred due to the inherent limitations of voice recognition software.  Lorrene Reid, PA-C

## 2022-01-11 ENCOUNTER — Ambulatory Visit: Payer: Managed Care, Other (non HMO) | Admitting: Physician Assistant

## 2022-01-12 IMAGING — US US ABDOMEN LIMITED
1 series · 14 of 20 positions shown · non-contrast
Comparison: None.

CLINICAL DATA: Umbilical hernia.  Palpable area in an umbilicus.

EXAM:
ULTRASOUND ABDOMEN LIMITED

[Series 1: us abdomen limited · 20 acquisitions, 14 frames shown]
[im 1/20]
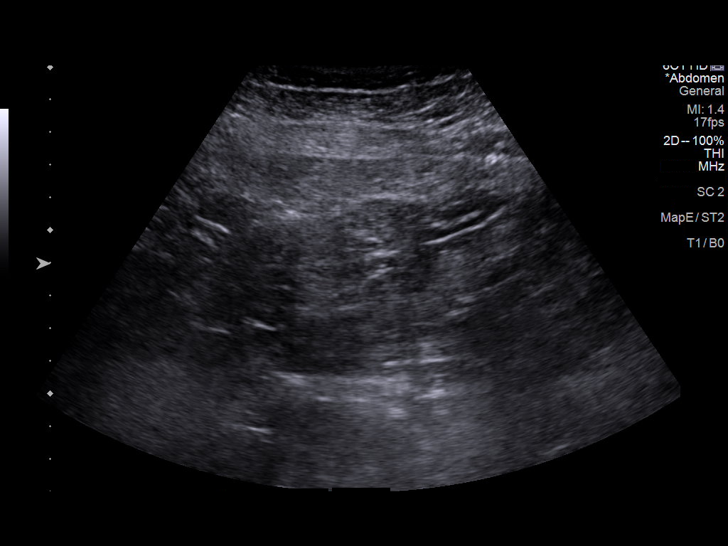
[im 3/20]
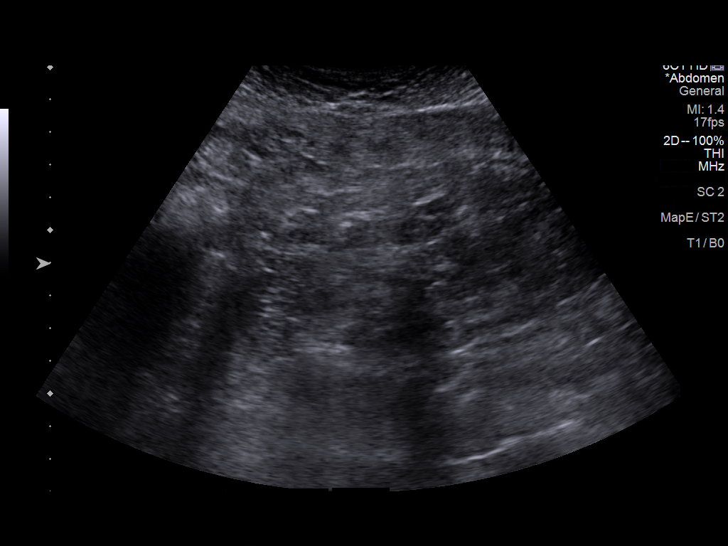
[im 4/20]
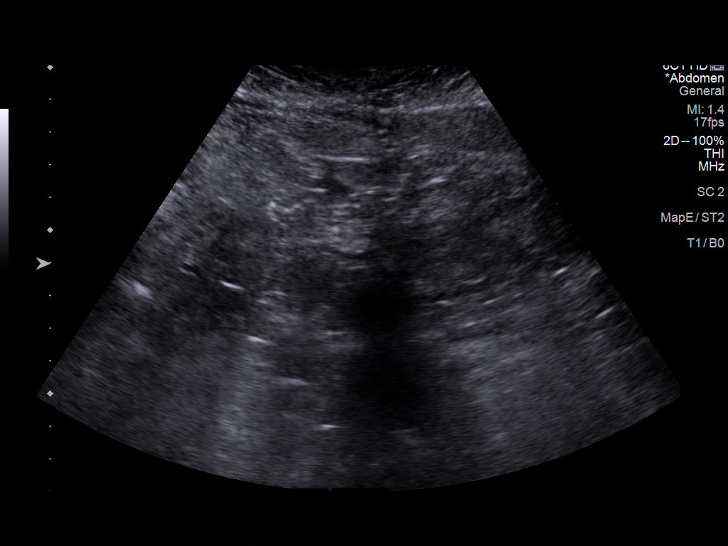
[im 6/20]
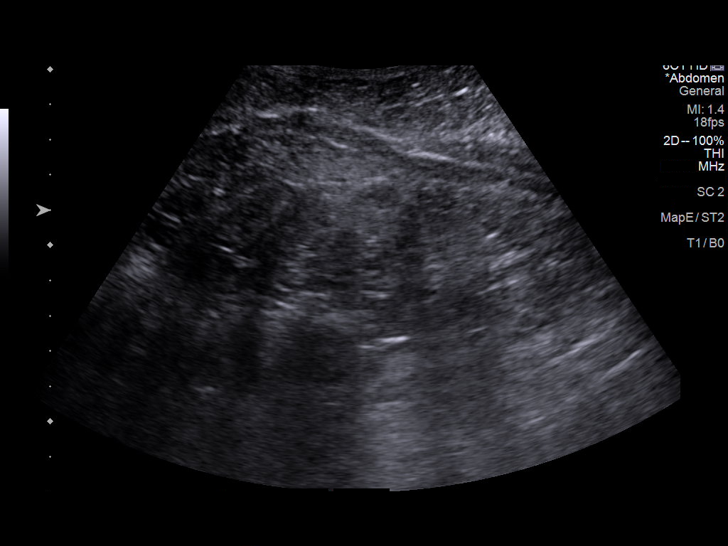
[im 7/20]
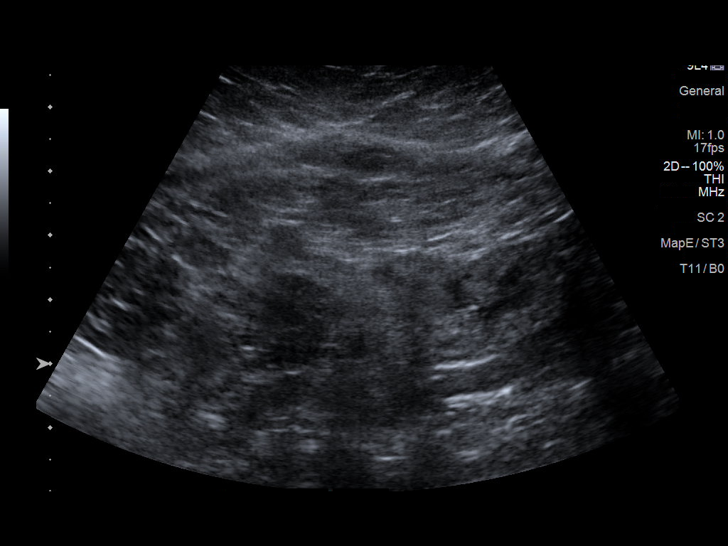
[im 8/20]
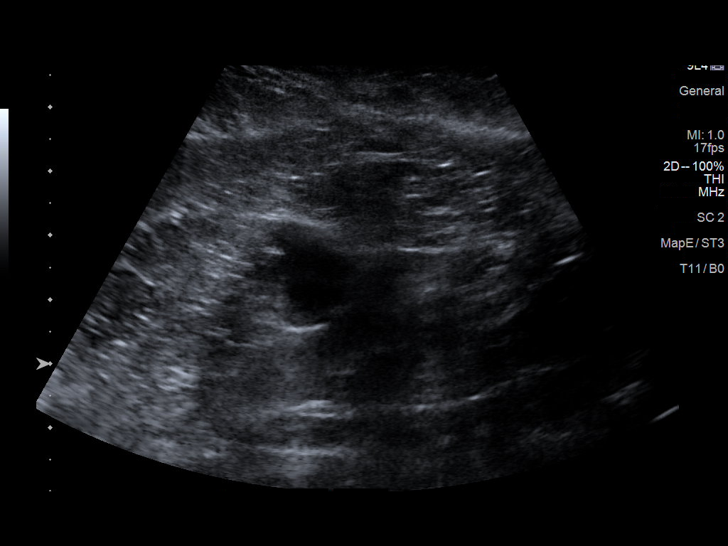
[im 10/20]
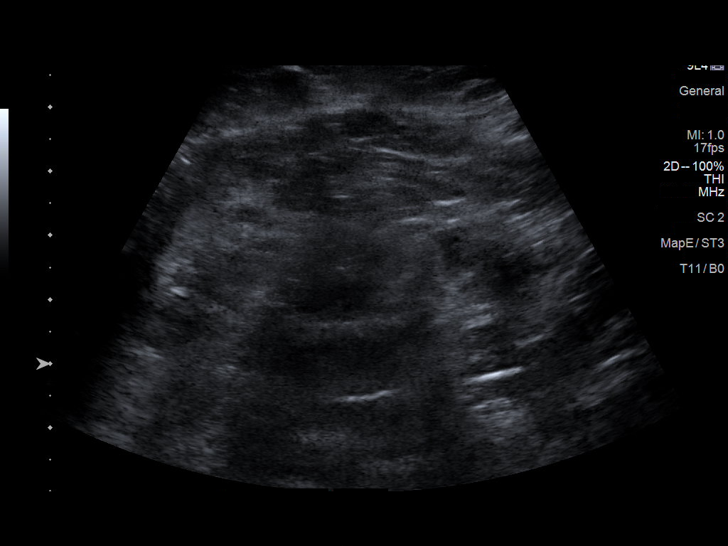
[im 11/20]
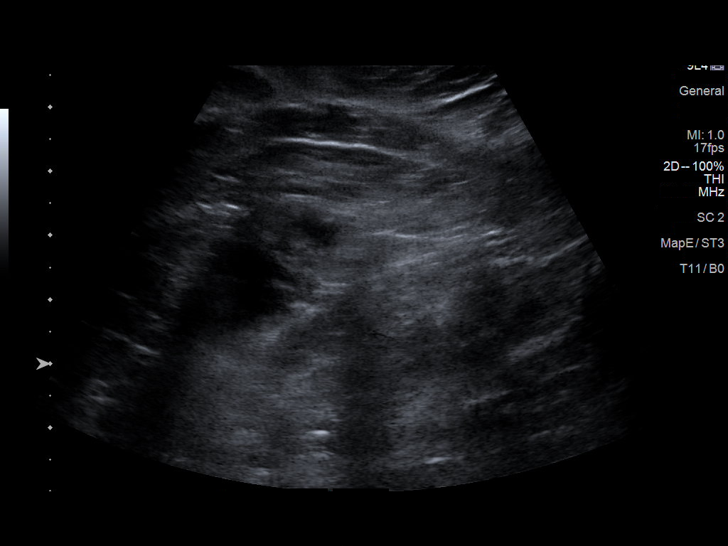
[im 13/20]
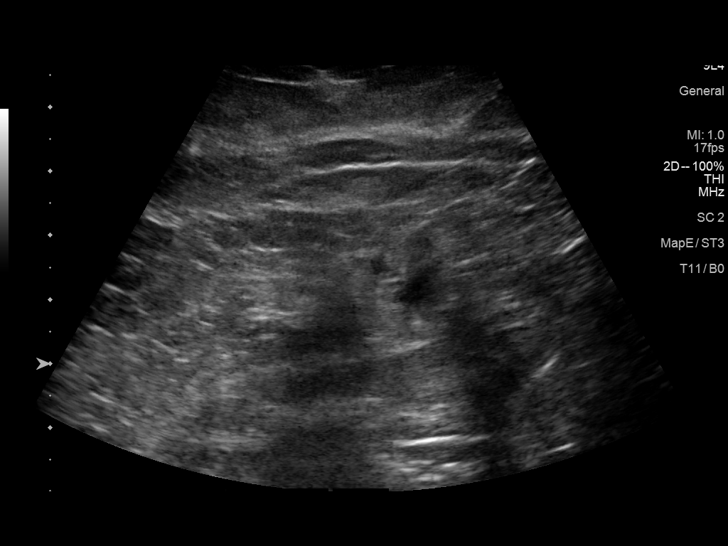
[im 14/20]
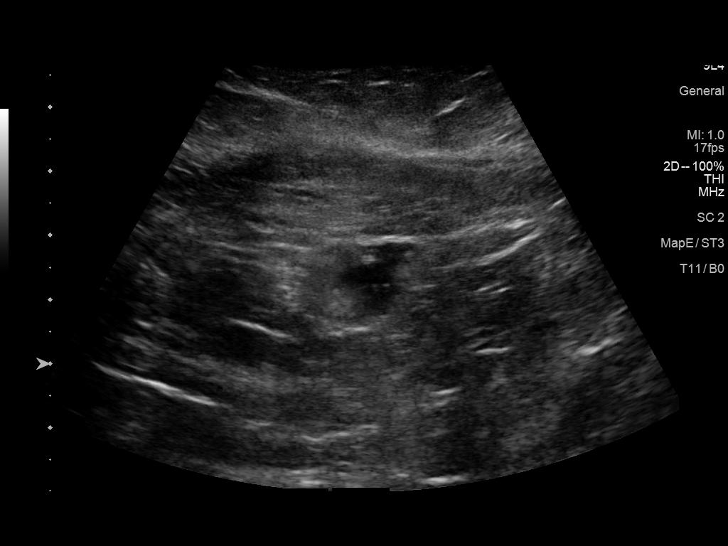
[im 16/20]
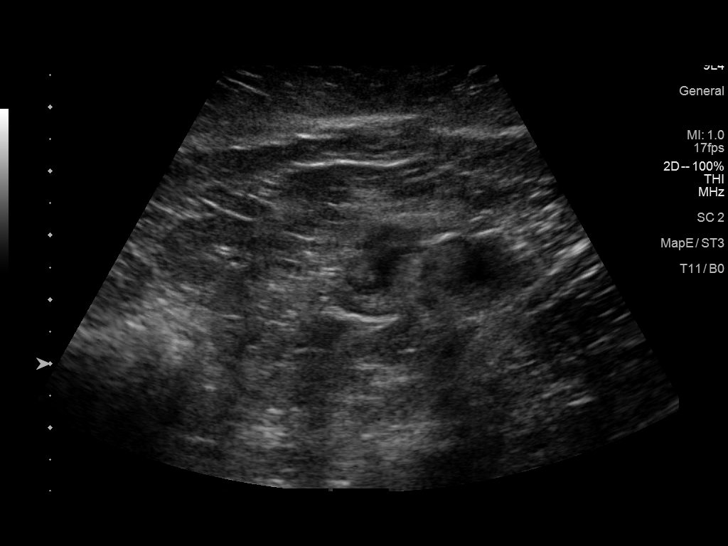
[im 17/20]
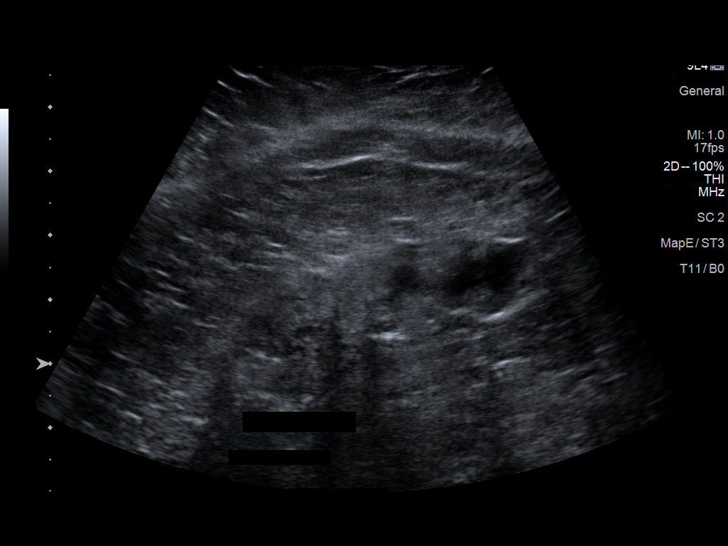
[im 18/20]
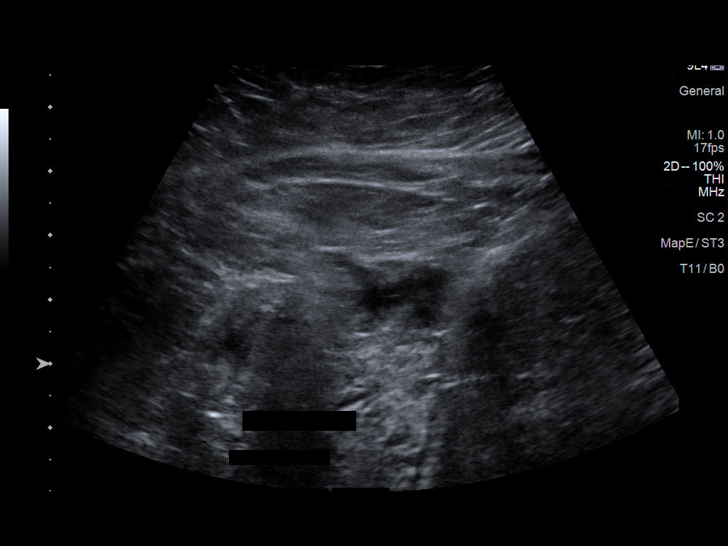
[im 20/20]
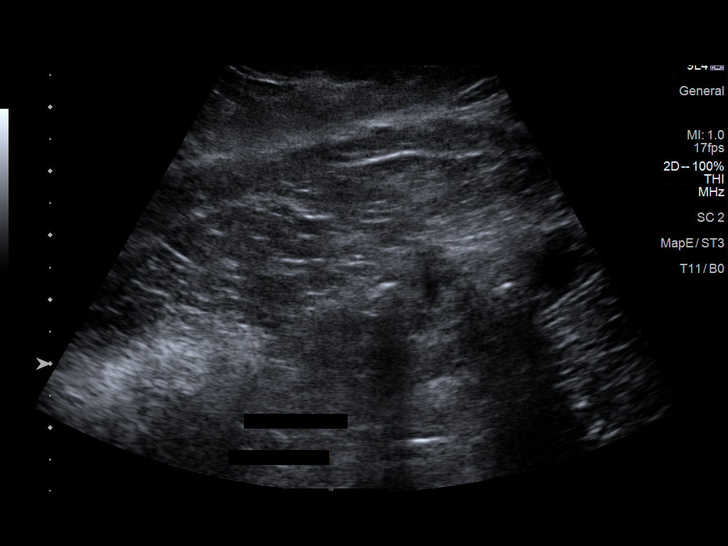

[14 of 20 positions shown; findings below may reference images not displayed]

FINDINGS: Targeted sonographic evaluation in the anterior abdomen in the area
of clinical concern. No definite abdominal wall defect or hernia is
seen. Imaging obtained both during Valsalva and while standing.
Peristalsing bowel loops are seen in the upper abdomen. No focal
fluid collection or evidence of mass.
IMPRESSION: No sonographic evidence of umbilical hernia or focal lesion.

## 2022-03-10 ENCOUNTER — Other Ambulatory Visit: Payer: Self-pay | Admitting: Physician Assistant

## 2022-03-10 DIAGNOSIS — E7800 Pure hypercholesterolemia, unspecified: Secondary | ICD-10-CM

## 2022-03-10 DIAGNOSIS — I10 Essential (primary) hypertension: Secondary | ICD-10-CM

## 2022-03-22 ENCOUNTER — Ambulatory Visit: Payer: Managed Care, Other (non HMO) | Admitting: Physician Assistant

## 2022-06-12 ENCOUNTER — Other Ambulatory Visit: Payer: Self-pay | Admitting: Physician Assistant

## 2022-06-12 DIAGNOSIS — I10 Essential (primary) hypertension: Secondary | ICD-10-CM

## 2022-06-12 DIAGNOSIS — E7800 Pure hypercholesterolemia, unspecified: Secondary | ICD-10-CM

## 2022-07-10 ENCOUNTER — Other Ambulatory Visit: Payer: Self-pay | Admitting: Physician Assistant

## 2022-07-10 DIAGNOSIS — E7800 Pure hypercholesterolemia, unspecified: Secondary | ICD-10-CM

## 2022-07-10 DIAGNOSIS — I10 Essential (primary) hypertension: Secondary | ICD-10-CM

## 2022-07-19 ENCOUNTER — Other Ambulatory Visit: Payer: Self-pay

## 2022-07-19 ENCOUNTER — Telehealth: Payer: Self-pay | Admitting: Physician Assistant

## 2022-07-19 DIAGNOSIS — E7800 Pure hypercholesterolemia, unspecified: Secondary | ICD-10-CM

## 2022-07-19 DIAGNOSIS — I10 Essential (primary) hypertension: Secondary | ICD-10-CM

## 2022-07-19 MED ORDER — LISINOPRIL 5 MG PO TABS: 5.0000 mg | ORAL_TABLET | Freq: Every day | ORAL | 0 refills | Status: DC

## 2022-07-19 NOTE — Telephone Encounter (Signed)
Patient requesting refill of blood pressure medication. Please advise.

## 2022-07-19 NOTE — Telephone Encounter (Signed)
Sent in a 15 day supply patient is due for a F/u visit

## 2022-07-26 ENCOUNTER — Other Ambulatory Visit: Payer: Self-pay | Admitting: Physician Assistant

## 2022-07-26 DIAGNOSIS — E7800 Pure hypercholesterolemia, unspecified: Secondary | ICD-10-CM

## 2022-07-26 DIAGNOSIS — I10 Essential (primary) hypertension: Secondary | ICD-10-CM

## 2022-07-29 ENCOUNTER — Encounter: Payer: Self-pay | Admitting: Physician Assistant

## 2022-07-29 ENCOUNTER — Ambulatory Visit: Payer: Managed Care, Other (non HMO) | Admitting: Physician Assistant

## 2022-07-29 VITALS — BP 117/84 | HR 88 | Temp 97.7°F | Ht 67.0 in | Wt 186.0 lb

## 2022-07-29 DIAGNOSIS — E7800 Pure hypercholesterolemia, unspecified: Secondary | ICD-10-CM | POA: Diagnosis not present

## 2022-07-29 DIAGNOSIS — Z6829 Body mass index (BMI) 29.0-29.9, adult: Secondary | ICD-10-CM | POA: Diagnosis not present

## 2022-07-29 DIAGNOSIS — E663 Overweight: Secondary | ICD-10-CM

## 2022-07-29 DIAGNOSIS — I10 Essential (primary) hypertension: Secondary | ICD-10-CM

## 2022-07-29 MED ORDER — LISINOPRIL 5 MG PO TABS: 5.0000 mg | ORAL_TABLET | Freq: Every day | ORAL | 2 refills | Status: DC

## 2022-07-29 NOTE — Assessment & Plan Note (Signed)
-  Stable. Encourage to continue weight loss efforts. Will continue current medication regimen. Will continue to monitor. Will collect CMP for medication monitoring.

## 2022-07-29 NOTE — Progress Notes (Signed)
Established patient visit   Patient: Allison Wallace   DOB: 20-Apr-1971   51 y.o. Female  MRN: [id number] Visit Date: 07/29/2022  Chief Complaint  Patient presents with   Hypertension   Subjective    HPI  Patient presents for chronic follow-up. Patient reports has worked on diet changes to help with weight loss.   HTN: Pt denies chest pain, palpitations, dizziness or headache. Taking medication as directed without side effects. States has not checked BP at home but when going to other doctor appointments it has been normal.   HLD: Pt managing with diet. Report eating less bread and soft drinks. Walking 1-2 miles daily.    Medications: Outpatient Medications Prior to Visit  Medication Sig   calcium elemental as carbonate (BARIATRIC TUMS ULTRA) 400 MG chewable tablet Chew 2,000-3,000 mg by mouth as needed for heartburn.   Multiple Vitamin (ONE-A-DAY ADULT VITACRAVES+DHA) CHEW Chew by mouth.   Vitamin D, Ergocalciferol, (DRISDOL) 1.25 MG (50000 UNIT) CAPS capsule TAKE 1 CAPSULE (50,000 UNITS TOTAL) BY MOUTH EVERY 7 (SEVEN) DAYS   [DISCONTINUED] lisinopril (ZESTRIL) 5 MG tablet Take 1 tablet (5 mg total) by mouth daily. PATIENT NEEDS APT FOR REFILLS   [DISCONTINUED] minocycline (DYNACIN) 100 MG tablet Take 1 tablet (100 mg total) by mouth 2 (two) times daily.   [DISCONTINUED] Semaglutide-Weight Management (WEGOVY) 0.25 MG/0.5ML SOAJ Inject 0.25 mg into the skin once a week.   No facility-administered medications prior to visit.    Review of Systems Review of Systems:  A fourteen system review of systems was performed and found to be positive as per HPI.  Last CBC Lab Results  Component Value Date   WBC 7.8 11/19/2021   HGB 16.0 (H) 11/19/2021   HCT 46.7 (H) 11/19/2021   MCV 107 (H) 11/19/2021   MCH 36.6 (H) 11/19/2021   RDW 11.9 11/19/2021   PLT 284 83/29/1916   Last metabolic panel Lab Results  Component Value Date   GLUCOSE 92 11/19/2021   NA 139 11/19/2021   K  4.5 11/19/2021   CL 103 11/19/2021   CO2 24 11/19/2021   BUN 7 11/19/2021   CREATININE 0.71 11/19/2021   EGFR 103 11/19/2021   CALCIUM 8.9 11/19/2021   PROT 6.3 11/19/2021   ALBUMIN 4.2 11/19/2021   LABGLOB 2.1 11/19/2021   AGRATIO 2.0 11/19/2021   BILITOT 0.5 11/19/2021   ALKPHOS 118 11/19/2021   AST 19 11/19/2021   ALT 37 (H) 11/19/2021   Last lipids Lab Results  Component Value Date   CHOL 219 (H) 11/19/2021   HDL 48 11/19/2021   LDLCALC 137 (H) 11/19/2021   TRIG 188 (H) 11/19/2021   CHOLHDL 4.6 (H) 11/19/2021   Last hemoglobin A1c Lab Results  Component Value Date   HGBA1C 4.9 11/19/2021   Last thyroid functions Lab Results  Component Value Date   TSH 2.370 11/19/2021   Last vitamin D Lab Results  Component Value Date   VD25OH 18.7 (L) 05/22/2021     Objective    BP 117/84   Pulse 88   Temp 97.7 F (36.5 C)   Ht $R'5\' 7"'eN$  (1.702 m)   Wt 186 lb (84.4 kg)   LMP  (LMP Unknown)   SpO2 99%   BMI 29.13 kg/m  BP Readings from Last 3 Encounters:  07/29/22 117/84  12/12/21 128/85  11/21/21 133/84   Wt Readings from Last 3 Encounters:  07/29/22 186 lb (84.4 kg)  12/12/21 199 lb (90.3 kg)  11/21/21 201 lb  3.2 oz (91.3 kg)    Physical Exam  General:  Pleasant and cooperative, appropriate for stated age.  Neuro:  Alert and oriented,  extra-ocular muscles intact  HEENT:  Normocephalic, atraumatic, neck supple  Skin:  no gross rash, warm, pink. Cardiac:  RRR, S1 S2 Respiratory: CTA B/L  Vascular:  Ext warm, no cyanosis apprec.; cap RF less 2 sec. No edema. Psych:  No HI/SI, judgement and insight good, Euthymic mood. Full Affect.   No results found for any visits on 07/29/22.  Assessment & Plan      Problem List Items Addressed This Visit       Cardiovascular and Mediastinum   HTN, goal below 130/80 - Primary    -Stable. Encourage to continue weight loss efforts. Will continue current medication regimen. Will continue to monitor. Will collect CMP  for medication monitoring.      Relevant Medications   lisinopril (ZESTRIL) 5 MG tablet   Other Relevant Orders   Comp Met (CMET)     Other   Elevated LDL cholesterol level    -Last lipid panel: HDL 48, LDL 137 -Patient managing with diet and lifestyle interventions. -Will repeat lipid panel today.       Relevant Medications   lisinopril (ZESTRIL) 5 MG tablet   Other Relevant Orders   Lipid Profile   Other Visit Diagnoses     Overweight with body mass index (BMI) of 29 to 29.9 in adult          Overweight with body mass index (BMI) of 29 to 29.9 in adult: -Praised patient 13 pound loss since last visit in Dec 2022. Encourage to continue weight loss efforts with diet and lifestyle changes.    Return for CPE and FBW after 11/21/22.        Lorrene Reid, PA-C  [facility] [phone] (phone) [phone] (fax)  [place]

## 2022-07-29 NOTE — Patient Instructions (Signed)
Managing Your Hypertension Hypertension, also called high blood pressure, is when the force of the blood pressing against the walls of the arteries is too strong. Arteries are blood vessels that carry blood from your heart throughout your body. Hypertension forces the heart to work harder to pump blood and may cause the arteries to become narrow or stiff. Understanding blood pressure readings A blood pressure reading includes a higher number over a lower number: The first, or top, number is called the systolic pressure. It is a measure of the pressure in your arteries as your heart beats. The second, or bottom number, is called the diastolic pressure. It is a measure of the pressure in your arteries as the heart relaxes. For most people, a normal blood pressure is below 120/80. Your personal target blood pressure may vary depending on your medical conditions, your age, and other factors. Blood pressure is classified into four stages. Based on your blood pressure reading, your health care provider may use the following stages to determine what type of treatment you need, if any. Systolic pressure and diastolic pressure are measured in a unit called millimeters of mercury (mmHg). Normal Systolic pressure: below 120. Diastolic pressure: below 80. Elevated Systolic pressure: 120-129. Diastolic pressure: below 80. Hypertension stage 1 Systolic pressure: 130-139. Diastolic pressure: 80-89. Hypertension stage 2 Systolic pressure: 140 or above. Diastolic pressure: 90 or above. How can this condition affect me? Managing your hypertension is very important. Over time, hypertension can damage the arteries and decrease blood flow to parts of the body, including the brain, heart, and kidneys. Having untreated or uncontrolled hypertension can lead to: A heart attack. A stroke. A weakened blood vessel (aneurysm). Heart failure. Kidney damage. Eye damage. Memory and concentration problems. Vascular  dementia. What actions can I take to manage this condition? Hypertension can be managed by making lifestyle changes and possibly by taking medicines. Your health care provider will help you make a plan to bring your blood pressure within a normal range. You may be referred for counseling on a healthy diet and physical activity. Nutrition  Eat a diet that is high in fiber and potassium, and low in salt (sodium), added sugar, and fat. An example eating plan is called the DASH diet. DASH stands for Dietary Approaches to Stop Hypertension. To eat this way: Eat plenty of fresh fruits and vegetables. Try to fill one-half of your plate at each meal with fruits and vegetables. Eat whole grains, such as whole-wheat pasta, brown rice, or whole-grain bread. Fill about one-fourth of your plate with whole grains. Eat low-fat dairy products. Avoid fatty cuts of meat, processed or cured meats, and poultry with skin. Fill about one-fourth of your plate with lean proteins such as fish, chicken without skin, beans, eggs, and tofu. Avoid pre-made and processed foods. These tend to be higher in sodium, added sugar, and fat. Reduce your daily sodium intake. Many people with hypertension should eat less than 1,500 mg of sodium a day. Lifestyle  Work with your health care provider to maintain a healthy body weight or to lose weight. Ask what an ideal weight is for you. Get at least 30 minutes of exercise that causes your heart to beat faster (aerobic exercise) most days of the week. Activities may include walking, swimming, or biking. Include exercise to strengthen your muscles (resistance exercise), such as weight lifting, as part of your weekly exercise routine. Try to do these types of exercises for 30 minutes at least 3 days a week. Do   not use any products that contain nicotine or tobacco. These products include cigarettes, chewing tobacco, and vaping devices, such as e-cigarettes. If you need help quitting, ask your  health care provider. Control any long-term (chronic) conditions you have, such as high cholesterol or diabetes. Identify your sources of stress and find ways to manage stress. This may include meditation, deep breathing, or making time for fun activities. Alcohol use Do not drink alcohol if: Your health care provider tells you not to drink. You are pregnant, may be pregnant, or are planning to become pregnant. If you drink alcohol: Limit how much you have to: 0-1 drink a day for women. 0-2 drinks a day for men. Know how much alcohol is in your drink. In the [place] one drink equals one 12 oz bottle of beer (355 mL), one 5 oz glass of wine (148 mL), or one 1 oz glass of hard liquor (44 mL). Medicines Your health care provider may prescribe medicine if lifestyle changes are not enough to get your blood pressure under control and if: Your systolic blood pressure is 130 or higher. Your diastolic blood pressure is 80 or higher. Take medicines only as told by your health care provider. Follow the directions carefully. Blood pressure medicines must be taken as told by your health care provider. The medicine does not work as well when you skip doses. Skipping doses also puts you at risk for problems. Monitoring Before you monitor your blood pressure: Do not smoke, drink caffeinated beverages, or exercise within 30 minutes before taking a measurement. Use the bathroom and empty your bladder (urinate). Sit quietly for at least 5 minutes before taking measurements. Monitor your blood pressure at home as told by your health care provider. To do this: Sit with your back straight and supported. Place your feet flat on the floor. Do not cross your legs. Support your arm on a flat surface, such as a table. Make sure your upper arm is at heart level. Each time you measure, take two or three readings one minute apart and record the results. You may also need to have your blood pressure checked regularly by  your health care provider. General information Talk with your health care provider about your diet, exercise habits, and other lifestyle factors that may be contributing to hypertension. Review all the medicines you take with your health care provider because there may be side effects or interactions. Keep all follow-up visits. Your health care provider can help you create and adjust your plan for managing your high blood pressure. Where to find more information National Heart, Lung, and Blood Institute: www.nhlbi.nih.gov American Heart Association: www.heart.org Contact a health care provider if: You think you are having a reaction to medicines you have taken. You have repeated (recurrent) headaches. You feel dizzy. You have swelling in your ankles. You have trouble with your vision. Get help right away if: You develop a severe headache or confusion. You have unusual weakness or numbness, or you feel faint. You have severe pain in your chest or abdomen. You vomit repeatedly. You have trouble breathing. These symptoms may be an emergency. Get help right away. Call 911. Do not wait to see if the symptoms will go away. Do not drive yourself to the hospital. Summary Hypertension is when the force of blood pumping through your arteries is too strong. If this condition is not controlled, it may put you at risk for serious complications. Your personal target blood pressure may vary depending on your medical conditions,   your age, and other factors. For most people, a normal blood pressure is less than 120/80. Hypertension is managed by lifestyle changes, medicines, or both. Lifestyle changes to help manage hypertension include losing weight, eating a healthy, low-sodium diet, exercising more, stopping smoking, and limiting alcohol. This information is not intended to replace advice given to you by your health care provider. Make sure you discuss any questions you have with your health care  provider. Document Revised: 08/30/2021 Document Reviewed: 08/30/2021 Elsevier Patient Education  2023 Elsevier Inc.  

## 2022-07-29 NOTE — Assessment & Plan Note (Signed)
-  Last lipid panel: HDL 48, LDL 137 -Patient managing with diet and lifestyle interventions. -Will repeat lipid panel today.

## 2022-07-30 LAB — LIPID PANEL
Chol/HDL Ratio: 5.1 ratio — ABNORMAL HIGH (ref 0.0–4.4)
Cholesterol, Total: 240 mg/dL — ABNORMAL HIGH (ref 100–199)
HDL: 47 mg/dL (ref >39)
LDL Chol Calc (NIH): 154 mg/dL — ABNORMAL HIGH (ref 0–99)
Triglycerides: 211 mg/dL — ABNORMAL HIGH (ref 0–149)
VLDL Cholesterol Cal: 39 mg/dL (ref 5–40)

## 2022-07-30 LAB — COMPREHENSIVE METABOLIC PANEL
ALT: 43 IU/L — ABNORMAL HIGH (ref 0–32)
AST: 38 IU/L (ref 0–40)
Albumin/Globulin Ratio: 2 (ref 1.2–2.2)
Albumin: 4.5 g/dL (ref 3.8–4.9)
Alkaline Phosphatase: 121 IU/L (ref 44–121)
BUN/Creatinine Ratio: 9 (ref 9–23)
BUN: 7 mg/dL (ref 6–24)
Bilirubin Total: 0.5 mg/dL (ref 0.0–1.2)
CO2: 24 mmol/L (ref 20–29)
Calcium: 10 mg/dL (ref 8.7–10.2)
Chloride: 106 mmol/L (ref 96–106)
Creatinine, Ser: 0.82 mg/dL (ref 0.57–1.00)
Globulin, Total: 2.2 g/dL (ref 1.5–4.5)
Glucose: 92 mg/dL (ref 70–99)
Potassium: 4.4 mmol/L (ref 3.5–5.2)
Sodium: 144 mmol/L (ref 134–144)
Total Protein: 6.7 g/dL (ref 6.0–8.5)
eGFR: 87 mL/min/{1.73_m2} (ref >59)

## 2022-07-31 ENCOUNTER — Other Ambulatory Visit: Payer: Managed Care, Other (non HMO)

## 2022-07-31 DIAGNOSIS — R945 Abnormal results of liver function studies: Secondary | ICD-10-CM

## 2022-08-01 ENCOUNTER — Encounter: Payer: Self-pay | Admitting: Physician Assistant

## 2022-08-01 DIAGNOSIS — R7989 Other specified abnormal findings of blood chemistry: Secondary | ICD-10-CM

## 2022-08-01 LAB — HEPATITIS B SURFACE ANTIBODY,QUALITATIVE: Hep B Surface Ab, Qual: NONREACTIVE

## 2022-08-01 LAB — HEPATITIS C ANTIBODY: Hep C Virus Ab: NONREACTIVE

## 2022-08-01 LAB — HEPATITIS A ANTIBODY, IGM: Hep A IgM: NEGATIVE

## 2022-08-05 ENCOUNTER — Other Ambulatory Visit: Payer: Self-pay | Admitting: Physician Assistant

## 2022-08-05 DIAGNOSIS — E559 Vitamin D deficiency, unspecified: Secondary | ICD-10-CM

## 2022-08-06 MED ORDER — VITAMIN D (ERGOCALCIFEROL) 1.25 MG (50000 UNIT) PO CAPS: 50000.0000 [IU] | ORAL_CAPSULE | ORAL | 0 refills | Status: DC

## 2022-08-09 ENCOUNTER — Ambulatory Visit
Admission: RE | Admit: 2022-08-09 | Discharge: 2022-08-09 | Disposition: A | Discharge status: Home / Self Care | Payer: Managed Care, Other (non HMO) | Source: Ambulatory Visit | Attending: Physician Assistant | Admitting: Physician Assistant

## 2022-08-09 DIAGNOSIS — R7989 Other specified abnormal findings of blood chemistry: Secondary | ICD-10-CM

## 2022-08-23 ENCOUNTER — Telehealth: Payer: Self-pay | Admitting: Physician Assistant

## 2022-08-23 DIAGNOSIS — E7800 Pure hypercholesterolemia, unspecified: Secondary | ICD-10-CM

## 2022-08-23 MED ORDER — ROSUVASTATIN CALCIUM 10 MG PO TABS: 10.0000 mg | ORAL_TABLET | Freq: Every day | ORAL | 0 refills | Status: DC

## 2022-08-23 NOTE — Telephone Encounter (Signed)
Patient called and is agreeable to starting cholesterol medication.

## 2022-08-23 NOTE — Telephone Encounter (Signed)
Patient aware.

## 2022-08-23 NOTE — Telephone Encounter (Signed)
Crestor '10mg'$  sent to pharmacy per maritza. Pt needs to schedule labs in 6 wks for labs

## 2022-09-13 ENCOUNTER — Other Ambulatory Visit: Payer: Self-pay | Admitting: Physician Assistant

## 2022-09-13 DIAGNOSIS — E559 Vitamin D deficiency, unspecified: Secondary | ICD-10-CM

## 2022-09-30 ENCOUNTER — Other Ambulatory Visit: Payer: Self-pay

## 2022-09-30 DIAGNOSIS — E7800 Pure hypercholesterolemia, unspecified: Secondary | ICD-10-CM

## 2022-10-01 ENCOUNTER — Other Ambulatory Visit: Payer: Self-pay

## 2022-10-01 DIAGNOSIS — I10 Essential (primary) hypertension: Secondary | ICD-10-CM

## 2022-10-02 ENCOUNTER — Other Ambulatory Visit: Payer: Managed Care, Other (non HMO)

## 2022-10-02 DIAGNOSIS — I10 Essential (primary) hypertension: Secondary | ICD-10-CM

## 2022-10-02 DIAGNOSIS — E7800 Pure hypercholesterolemia, unspecified: Secondary | ICD-10-CM

## 2022-10-03 LAB — LIPID PANEL
Chol/HDL Ratio: 2.6 ratio (ref 0.0–4.4)
Cholesterol, Total: 153 mg/dL (ref 100–199)
HDL: 59 mg/dL (ref >39)
LDL Chol Calc (NIH): 74 mg/dL (ref 0–99)
Triglycerides: 112 mg/dL (ref 0–149)
VLDL Cholesterol Cal: 20 mg/dL (ref 5–40)

## 2022-10-03 LAB — COMPREHENSIVE METABOLIC PANEL
ALT: 41 IU/L — ABNORMAL HIGH (ref 0–32)
AST: 36 IU/L (ref 0–40)
Albumin/Globulin Ratio: 2.3 — ABNORMAL HIGH (ref 1.2–2.2)
Albumin: 4.5 g/dL (ref 3.8–4.9)
Alkaline Phosphatase: 108 IU/L (ref 44–121)
BUN/Creatinine Ratio: 13 (ref 9–23)
BUN: 9 mg/dL (ref 6–24)
Bilirubin Total: 0.4 mg/dL (ref 0.0–1.2)
CO2: 21 mmol/L (ref 20–29)
Calcium: 9.6 mg/dL (ref 8.7–10.2)
Chloride: 106 mmol/L (ref 96–106)
Creatinine, Ser: 0.7 mg/dL (ref 0.57–1.00)
Globulin, Total: 2 g/dL (ref 1.5–4.5)
Glucose: 91 mg/dL (ref 70–99)
Potassium: 4.6 mmol/L (ref 3.5–5.2)
Sodium: 142 mmol/L (ref 134–144)
Total Protein: 6.5 g/dL (ref 6.0–8.5)
eGFR: 104 mL/min/{1.73_m2} (ref >59)

## 2022-10-16 ENCOUNTER — Other Ambulatory Visit: Payer: Self-pay | Admitting: Physician Assistant

## 2022-10-16 DIAGNOSIS — E7800 Pure hypercholesterolemia, unspecified: Secondary | ICD-10-CM

## 2023-02-12 NOTE — Progress Notes (Unsigned)
   Complete physical exam  Patient: Allison Wallace   DOB: 11-May-1971   52 y.o. Female  MRN: [id number]  Subjective:    No chief complaint on file.   Allison Wallace is a 52 y.o. female who presents today for a complete physical exam. She reports consuming a {diet types:17450} diet. {types:19826} She generally feels {DESC; WELL/FAIRLY WELL/POORLY:18703}. She reports sleeping {DESC; WELL/FAIRLY WELL/POORLY:18703}. She {does/does not:200015} have additional problems to discuss today.    Most recent fall risk assessment:    07/29/2022    8:09 AM  [place] in the past year? 0  Number falls in past yr: 0  Injury with Fall? 0  Risk for fall due to : No Fall Risks  Follow up Falls evaluation completed     Most recent depression screenings:    07/29/2022    8:09 AM 12/12/2021    2:49 PM  PHQ 2/9 Scores  PHQ - 2 Score 0 0  PHQ- 9 Score 0 0    {VISON DENTAL STD PSA (Optional):27386}  {History (Optional):23778}  Patient Care Team: Lorrene Reid, PA-C as PCP - General (Physician Assistant) Allyn Kenner, MD (Dermatology) Olga Millers, MD as Consulting Physician (Obstetrics and Gynecology)   Outpatient Medications Prior to Visit  Medication Sig   calcium elemental as carbonate (BARIATRIC TUMS ULTRA) 400 MG chewable tablet Chew 2,000-3,000 mg by mouth as needed for heartburn.   lisinopril (ZESTRIL) 5 MG tablet Take 1 tablet (5 mg total) by mouth daily.   Multiple Vitamin (ONE-A-DAY ADULT VITACRAVES+DHA) CHEW Chew by mouth.   rosuvastatin (CRESTOR) 10 MG tablet TAKE 1 TABLET BY MOUTH EVERY DAY   Vitamin D, Ergocalciferol, (DRISDOL) 1.25 MG (50000 UNIT) CAPS capsule TAKE 1 CAPSULE (50,000 UNITS TOTAL) BY MOUTH EVERY 7 (SEVEN) DAYS   No facility-administered medications prior to visit.    ROS        Objective:     There were no vitals taken for this visit. {Vitals History (Optional):23777}  Physical Exam   No results found for any visits on  02/13/23. {Show previous labs (optional):23779}    Assessment & Plan:    Routine Health Maintenance and Physical Exam  Immunization History  Administered Date(s) Administered   Influenza,inj,Quad PF,6+ Mos 11/03/2020, 11/21/2021   Moderna Sars-Covid-2 Vaccination 07/20/2020, 08/17/2020   Tdap 07/06/2018    Health Maintenance  Topic Date Due   Zoster Vaccines- Shingrix (1 of 2) Never done   INFLUENZA VACCINE  07/30/2022   COVID-19 Vaccine (3 - 2023-24 season) 08/30/2022   MAMMOGRAM  04/21/2023   PAP SMEAR-Modifier  04/20/2024   DTaP/Tdap/Td (2 - Td or Tdap) 07/06/2028   COLONOSCOPY (Pts 45-76yr Insurance coverage will need to be confirmed)  04/19/2031   Hepatitis C Screening  Completed   HIV Screening  Completed   HPV VACCINES  Aged Out    Discussed health benefits of physical activity, and encouraged her to engage in regular exercise appropriate for her age and condition.  Problem List Items Addressed This Visit   None  No follow-ups on file.     MVelva Harman PA

## 2023-02-13 ENCOUNTER — Encounter: Payer: Managed Care, Other (non HMO) | Admitting: Physician Assistant

## 2023-02-13 ENCOUNTER — Ambulatory Visit (INDEPENDENT_AMBULATORY_CARE_PROVIDER_SITE_OTHER): Payer: Managed Care, Other (non HMO) | Admitting: Family Medicine

## 2023-02-13 ENCOUNTER — Encounter: Payer: Self-pay | Admitting: Family Medicine

## 2023-02-13 VITALS — BP 129/87 | HR 84 | Resp 18 | Ht 67.0 in | Wt 177.0 lb

## 2023-02-13 DIAGNOSIS — E7800 Pure hypercholesterolemia, unspecified: Secondary | ICD-10-CM

## 2023-02-13 DIAGNOSIS — Z0000 Encounter for general adult medical examination without abnormal findings: Secondary | ICD-10-CM | POA: Diagnosis not present

## 2023-02-13 DIAGNOSIS — R5383 Other fatigue: Secondary | ICD-10-CM

## 2023-02-13 DIAGNOSIS — E559 Vitamin D deficiency, unspecified: Secondary | ICD-10-CM | POA: Diagnosis not present

## 2023-02-13 DIAGNOSIS — Z23 Encounter for immunization: Secondary | ICD-10-CM

## 2023-02-13 DIAGNOSIS — I10 Essential (primary) hypertension: Secondary | ICD-10-CM | POA: Diagnosis not present

## 2023-02-13 DIAGNOSIS — E663 Overweight: Secondary | ICD-10-CM

## 2023-02-13 DIAGNOSIS — Z6827 Body mass index (BMI) 27.0-27.9, adult: Secondary | ICD-10-CM | POA: Insufficient documentation

## 2023-02-13 DIAGNOSIS — N951 Menopausal and female climacteric states: Secondary | ICD-10-CM | POA: Insufficient documentation

## 2023-02-13 NOTE — Assessment & Plan Note (Addendum)
Praised successful weight loss thus far of 22 pounds since December 2022.  Encourage to continue weight loss efforts with diet and lifestyle changes.  We discussed resources available to her either through PCP or meeting with a nutritionist should she ever need it.

## 2023-02-13 NOTE — Assessment & Plan Note (Signed)
Stable. Encourage to continue weight loss efforts. Will continue current medication regimen of lisinopril.  She does not need refills at this time.  Will continue to monitor. Will collect CMP for medication monitoring at follow-up in 6 months.

## 2023-02-13 NOTE — Assessment & Plan Note (Signed)
Checking lipid panel today. Patient managing with diet and lifestyle interventions.

## 2023-02-13 NOTE — Assessment & Plan Note (Signed)
LMP 05/2022.  We discussed the definitions of menopause versus perimenopause, as well as some of the symptoms that can be associated.  She does not need to manage the hot flashes right now and will continue to monitor menstrual cycles as she approaches true menopause.

## 2023-02-14 LAB — COMPREHENSIVE METABOLIC PANEL
ALT: 18 IU/L (ref 0–32)
AST: 20 IU/L (ref 0–40)
Albumin/Globulin Ratio: 2.3 — ABNORMAL HIGH (ref 1.2–2.2)
Albumin: 4.6 g/dL (ref 3.8–4.9)
Alkaline Phosphatase: 93 IU/L (ref 44–121)
BUN/Creatinine Ratio: 15 (ref 9–23)
BUN: 12 mg/dL (ref 6–24)
Bilirubin Total: <0.2 mg/dL (ref 0.0–1.2)
CO2: 22 mmol/L (ref 20–29)
Calcium: 9.4 mg/dL (ref 8.7–10.2)
Chloride: 107 mmol/L — ABNORMAL HIGH (ref 96–106)
Creatinine, Ser: 0.78 mg/dL (ref 0.57–1.00)
Globulin, Total: 2 g/dL (ref 1.5–4.5)
Glucose: 87 mg/dL (ref 70–99)
Potassium: 4.7 mmol/L (ref 3.5–5.2)
Sodium: 145 mmol/L — ABNORMAL HIGH (ref 134–144)
Total Protein: 6.6 g/dL (ref 6.0–8.5)
eGFR: 91 mL/min/{1.73_m2} (ref >59)

## 2023-02-14 LAB — CBC
Hematocrit: 42.9 % (ref 34.0–46.6)
Hemoglobin: 14.8 g/dL (ref 11.1–15.9)
MCH: 35.5 pg — ABNORMAL HIGH (ref 26.6–33.0)
MCHC: 34.5 g/dL (ref 31.5–35.7)
MCV: 103 fL — ABNORMAL HIGH (ref 79–97)
Platelets: 267 10*3/uL (ref 150–450)
RBC: 4.17 x10E6/uL (ref 3.77–5.28)
RDW: 11 % — ABNORMAL LOW (ref 11.7–15.4)
WBC: 7.4 10*3/uL (ref 3.4–10.8)

## 2023-02-14 LAB — LIPID PANEL
Chol/HDL Ratio: 2.7 ratio (ref 0.0–4.4)
Cholesterol, Total: 194 mg/dL (ref 100–199)
HDL: 73 mg/dL (ref >39)
LDL Chol Calc (NIH): 106 mg/dL — ABNORMAL HIGH (ref 0–99)
Triglycerides: 82 mg/dL (ref 0–149)
VLDL Cholesterol Cal: 15 mg/dL (ref 5–40)

## 2023-02-14 LAB — VITAMIN D 25 HYDROXY (VIT D DEFICIENCY, FRACTURES): Vit D, 25-Hydroxy: 37.2 ng/mL (ref 30.0–100.0)

## 2023-02-14 LAB — HEMOGLOBIN A1C
Est. average glucose Bld gHb Est-mCnc: 100 mg/dL
Hgb A1c MFr Bld: 5.1 % (ref 4.8–5.6)

## 2023-04-25 ENCOUNTER — Telehealth: Payer: Self-pay

## 2023-04-25 DIAGNOSIS — I10 Essential (primary) hypertension: Secondary | ICD-10-CM

## 2023-04-25 DIAGNOSIS — E7800 Pure hypercholesterolemia, unspecified: Secondary | ICD-10-CM

## 2023-04-25 MED ORDER — LISINOPRIL 5 MG PO TABS: 5.0000 mg | ORAL_TABLET | Freq: Every day | ORAL | 1 refills | Status: DC

## 2023-04-25 NOTE — Telephone Encounter (Signed)
Pt is requesting a refill on:  lisinopril (ZESTRIL) 5 MG tablet  rosuvastatin (CRESTOR) 10 MG tablet   Pharmacy: [id number] - [place], [place] - [address]   LOV 02/13/23 ROV 08/14/23

## 2023-04-25 NOTE — Telephone Encounter (Signed)
Refill sent.

## 2023-04-28 MED ORDER — ROSUVASTATIN CALCIUM 10 MG PO TABS: 10.0000 mg | ORAL_TABLET | Freq: Every day | ORAL | 1 refills | Status: DC

## 2023-04-28 NOTE — Addendum Note (Signed)
Addended by: Tonny Bollman on: 04/28/2023 04:58 PM   Modules accepted: Orders

## 2023-04-28 NOTE — Telephone Encounter (Signed)
Rosuvastatin has been sent.  

## 2023-04-28 NOTE — Telephone Encounter (Signed)
Pt called and stated that her Rosuvastatin did not get sent in with the other medications.  She is requesting a refill on below.     rosuvastatin (CRESTOR) 10 MG tablet    Pharmacy: [id number] - [place], [place] - [address]     LOV 02/13/23 ROV 08/14/23

## 2023-08-14 ENCOUNTER — Ambulatory Visit: Payer: Managed Care, Other (non HMO) | Admitting: Family Medicine

## 2023-08-14 ENCOUNTER — Encounter: Payer: Self-pay | Admitting: Family Medicine

## 2023-08-14 VITALS — BP 116/83 | HR 73 | Resp 18 | Ht 67.0 in | Wt 186.0 lb

## 2023-08-14 DIAGNOSIS — I10 Essential (primary) hypertension: Secondary | ICD-10-CM | POA: Diagnosis not present

## 2023-08-14 DIAGNOSIS — E7800 Pure hypercholesterolemia, unspecified: Secondary | ICD-10-CM | POA: Diagnosis not present

## 2023-08-14 DIAGNOSIS — E559 Vitamin D deficiency, unspecified: Secondary | ICD-10-CM | POA: Diagnosis not present

## 2023-08-14 MED ORDER — LISINOPRIL 5 MG PO TABS: 5.0000 mg | ORAL_TABLET | Freq: Every day | ORAL | 1 refills | Status: DC

## 2023-08-14 MED ORDER — ROSUVASTATIN CALCIUM 10 MG PO TABS: 10.0000 mg | ORAL_TABLET | Freq: Every day | ORAL | 1 refills | Status: DC

## 2023-08-14 NOTE — Assessment & Plan Note (Signed)
Discussed taking daily maintenance dosing of vitamin D.  Recommend 2000 to 4000 IU over-the-counter vitamin D3.

## 2023-08-14 NOTE — Progress Notes (Signed)
   Established Patient Office Visit  Subjective   Patient ID: Allison Wallace, female    DOB: 1971/01/08  Age: 52 y.o. MRN: [id number]  Chief Complaint  Patient presents with   Hypertension    Fasting    Hyperlipidemia    HPI  HTN-patient is taking her lisinopril.  No questions or concerns.  No side effects.  Takes it daily.  HLD-patient walks daily and "tries to eat healthy".  Discussed foods that are high in saturated fat.  Patient taking her statin daily.  Needs refill.  No side effects.  Vitamin D deficiency-patient is not taking a daily vitamin D supplement.  We discussed over-the-counter vitamin D dosing.  The 10-year ASCVD risk score (Arnett DK, et al., 2019) is: 3.1%  Health Maintenance Due  Topic Date Due   Zoster Vaccines- Shingrix (1 of 2) Never done   COVID-19 Vaccine (3 - 2023-24 season) 08/30/2022   MAMMOGRAM  04/21/2023   INFLUENZA VACCINE  07/31/2023      Objective:     BP 116/83 (BP Location: Left Arm, Patient Position: Sitting, Cuff Size: Normal)   Pulse 73   Resp 18   Ht 5\' 7"  (1.702 m)   Wt 186 lb (84.4 kg)   SpO2 99%   BMI 29.13 kg/m    Physical Exam General: Alert, oriented Pulmonary: No respiratory distress Psych: Pleasant affect   No results found for any visits on 08/14/23.      Assessment & Plan:   Vitamin D deficiency Assessment & Plan: Discussed taking daily maintenance dosing of vitamin D.  Recommend 2000 to 4000 IU over-the-counter vitamin D3.   HTN, goal below 130/80 Assessment & Plan: Continue current treatment.  Blood pressure normal today.  Refill sent in.  BMP ordered.  Orders: -     Basic metabolic panel -     Lisinopril; Take 1 tablet (5 mg total) by mouth daily.  Dispense: 90 tablet; Refill: 1  Elevated LDL cholesterol level Assessment & Plan: Patient taking atorvastatin daily.  No side effects, no muscle aches.  Will recheck cholesterol level today.  Was mildly elevated 4 months ago.  Orders: -      Lisinopril; Take 1 tablet (5 mg total) by mouth daily.  Dispense: 90 tablet; Refill: 1 -     Rosuvastatin Calcium; Take 1 tablet (10 mg total) by mouth daily.  Dispense: 90 tablet; Refill: 1 -     Lipid panel     Return in about 6 months (around 02/14/2024) for HTN, hld.    Sandre Kitty, MD

## 2023-08-14 NOTE — Assessment & Plan Note (Signed)
Continue current treatment.  Blood pressure normal today.  Refill sent in.  BMP ordered.

## 2023-08-14 NOTE — Patient Instructions (Signed)
It was nice to see you today,  We addressed the following topics today: -I have refilled your cholesterol and blood pressure medication - I will check your kidney function and cholesterol levels and we will let you know the results when I get them - You can take over-the-counter vitamin D3 supplementation daily at 2000 to 4000 IUs. - Follow-up in 6 months  Have a great day,  Frederic Jericho, MD

## 2023-08-14 NOTE — Assessment & Plan Note (Signed)
Patient taking atorvastatin daily.  No side effects, no muscle aches.  Will recheck cholesterol level today.  Was mildly elevated 4 months ago.

## 2023-08-15 LAB — BASIC METABOLIC PANEL
BUN/Creatinine Ratio: 14 (ref 9–23)
BUN: 11 mg/dL (ref 6–24)
CO2: 22 mmol/L (ref 20–29)
Calcium: 9.2 mg/dL (ref 8.7–10.2)
Chloride: 106 mmol/L (ref 96–106)
Creatinine, Ser: 0.76 mg/dL (ref 0.57–1.00)
Glucose: 87 mg/dL (ref 70–99)
Potassium: 4.9 mmol/L (ref 3.5–5.2)
Sodium: 143 mmol/L (ref 134–144)
eGFR: 94 mL/min/{1.73_m2} (ref >59)

## 2023-08-15 LAB — LIPID PANEL
Chol/HDL Ratio: 2.6 ratio (ref 0.0–4.4)
Cholesterol, Total: 180 mg/dL (ref 100–199)
HDL: 69 mg/dL (ref >39)
LDL Chol Calc (NIH): 89 mg/dL (ref 0–99)
Triglycerides: 127 mg/dL (ref 0–149)
VLDL Cholesterol Cal: 22 mg/dL (ref 5–40)

## 2024-01-23 ENCOUNTER — Other Ambulatory Visit: Payer: Self-pay | Admitting: Family Medicine

## 2024-01-23 DIAGNOSIS — I10 Essential (primary) hypertension: Secondary | ICD-10-CM

## 2024-01-23 DIAGNOSIS — E7800 Pure hypercholesterolemia, unspecified: Secondary | ICD-10-CM

## 2024-01-29 ENCOUNTER — Other Ambulatory Visit: Payer: Self-pay

## 2024-01-29 DIAGNOSIS — R5383 Other fatigue: Secondary | ICD-10-CM

## 2024-01-29 DIAGNOSIS — E559 Vitamin D deficiency, unspecified: Secondary | ICD-10-CM

## 2024-01-29 DIAGNOSIS — I10 Essential (primary) hypertension: Secondary | ICD-10-CM

## 2024-01-29 DIAGNOSIS — E7800 Pure hypercholesterolemia, unspecified: Secondary | ICD-10-CM

## 2024-02-05 ENCOUNTER — Encounter: Payer: Self-pay | Admitting: Family Medicine

## 2024-02-09 ENCOUNTER — Other Ambulatory Visit: Payer: Managed Care, Other (non HMO)

## 2024-02-09 DIAGNOSIS — E559 Vitamin D deficiency, unspecified: Secondary | ICD-10-CM

## 2024-02-09 DIAGNOSIS — E7800 Pure hypercholesterolemia, unspecified: Secondary | ICD-10-CM

## 2024-02-09 DIAGNOSIS — I10 Essential (primary) hypertension: Secondary | ICD-10-CM

## 2024-02-09 DIAGNOSIS — R5383 Other fatigue: Secondary | ICD-10-CM

## 2024-02-10 ENCOUNTER — Encounter: Payer: Self-pay | Admitting: Family Medicine

## 2024-02-10 LAB — VITAMIN B12: Vitamin B-12: 388 pg/mL (ref 232–1245)

## 2024-02-10 LAB — COMPREHENSIVE METABOLIC PANEL
ALT: 23 [IU]/L (ref 0–32)
AST: 23 [IU]/L (ref 0–40)
Albumin: 4.4 g/dL (ref 3.8–4.9)
Alkaline Phosphatase: 125 [IU]/L — ABNORMAL HIGH (ref 44–121)
BUN/Creatinine Ratio: 14 (ref 9–23)
BUN: 11 mg/dL (ref 6–24)
Bilirubin Total: 0.3 mg/dL (ref 0.0–1.2)
CO2: 22 mmol/L (ref 20–29)
Calcium: 9.5 mg/dL (ref 8.7–10.2)
Chloride: 106 mmol/L (ref 96–106)
Creatinine, Ser: 0.8 mg/dL (ref 0.57–1.00)
Globulin, Total: 2 g/dL (ref 1.5–4.5)
Glucose: 87 mg/dL (ref 70–99)
Potassium: 4.6 mmol/L (ref 3.5–5.2)
Sodium: 143 mmol/L (ref 134–144)
Total Protein: 6.4 g/dL (ref 6.0–8.5)
eGFR: 88 mL/min/{1.73_m2} (ref >59)

## 2024-02-10 LAB — LIPID PANEL
Chol/HDL Ratio: 3.3 {ratio} (ref 0.0–4.4)
Cholesterol, Total: 193 mg/dL (ref 100–199)
HDL: 58 mg/dL (ref >39)
LDL Chol Calc (NIH): 105 mg/dL — ABNORMAL HIGH (ref 0–99)
Triglycerides: 177 mg/dL — ABNORMAL HIGH (ref 0–149)
VLDL Cholesterol Cal: 30 mg/dL (ref 5–40)

## 2024-02-10 LAB — VITAMIN D 25 HYDROXY (VIT D DEFICIENCY, FRACTURES): Vit D, 25-Hydroxy: 44.4 ng/mL (ref 30.0–100.0)

## 2024-02-16 ENCOUNTER — Encounter: Payer: Managed Care, Other (non HMO) | Admitting: Family Medicine

## 2024-02-18 ENCOUNTER — Encounter: Payer: Managed Care, Other (non HMO) | Admitting: Family Medicine

## 2024-02-24 ENCOUNTER — Ambulatory Visit (INDEPENDENT_AMBULATORY_CARE_PROVIDER_SITE_OTHER): Payer: Managed Care, Other (non HMO) | Admitting: Family Medicine

## 2024-02-24 ENCOUNTER — Encounter: Payer: Self-pay | Admitting: Family Medicine

## 2024-02-24 VITALS — BP 129/82 | HR 92 | Ht 67.0 in | Wt 193.2 lb

## 2024-02-24 DIAGNOSIS — I10 Essential (primary) hypertension: Secondary | ICD-10-CM | POA: Diagnosis not present

## 2024-02-24 DIAGNOSIS — M7711 Lateral epicondylitis, right elbow: Secondary | ICD-10-CM | POA: Diagnosis not present

## 2024-02-24 DIAGNOSIS — M47816 Spondylosis without myelopathy or radiculopathy, lumbar region: Secondary | ICD-10-CM | POA: Diagnosis not present

## 2024-02-24 DIAGNOSIS — E782 Mixed hyperlipidemia: Secondary | ICD-10-CM | POA: Diagnosis not present

## 2024-02-24 NOTE — Assessment & Plan Note (Signed)
 Last lipid panel: LDL 105, HDL 58, triglycerides 177.  Slight increase in LDL and triglycerides.  Continue rosuvastatin 10 mg daily, encouraged to focus on nutritional and physical activity interventions.  Patient verbalized understanding and is agreeable to this plan.  Will continue to monitor.

## 2024-02-24 NOTE — Progress Notes (Signed)
 Established Patient Office Visit  Subjective   Patient ID: Allison Wallace, female    DOB: [birth year]  Age: 53 y.o. MRN: [id number]  Chief Complaint  Patient presents with   Annual Exam    HPI Allison Wallace is a 53 y.o. female presenting today for follow up of hypertension, hyperlipidemia.  She also notes that she has been experiencing pain of her lateral right elbow for several months.  It typically originates at the lateral epicondyle of her elbow and occasionally radiates inferiorly.  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  She also complains of worsening lumbar arthritis and would like to discuss all the treatment options. Hypertension: Patient here for follow-up of elevated blood pressure. She is exercising and is adherent to low salt diet.   Pt denies chest pain, SOB, dizziness, edema, syncope, fatigue or heart palpitations. Taking lisinopril, reports excellent compliance with treatment. Denies side effects. Hyperlipidemia: tolerating rosuvastatin 10 mg daily well with no myalgias or significant side effects.  The 10-year ASCVD risk score (Arnett DK, et al., 2019) is: 5.2%   Outpatient Medications Prior to Visit  Medication Sig   calcium elemental as carbonate (BARIATRIC TUMS ULTRA) 400 MG chewable tablet Chew 2,000-3,000 mg by mouth as needed for heartburn.   lisinopril (ZESTRIL) 5 MG tablet TAKE 1 TABLET (5 MG TOTAL) BY MOUTH DAILY.   Multiple Vitamin (ONE-A-DAY ADULT VITACRAVES+DHA) CHEW Chew by mouth.   rosuvastatin (CRESTOR) 10 MG tablet TAKE 1 TABLET BY MOUTH EVERY DAY   No facility-administered medications prior to visit.    ROS Negative unless otherwise noted in HPI   Objective:     BP 129/82   Pulse 92   Ht 5\' 7"  (1.702 m)   Wt 193 lb 4 oz (87.7 kg)   LMP 05/30/2022 (Within Weeks)   SpO2 99%   BMI 30.27 kg/m   Physical Exam Constitutional:      General: She is not in acute distress.    Appearance: Normal appearance.  HENT:      Head: Normocephalic and atraumatic.  Cardiovascular:     Rate and Rhythm: Normal rate and regular rhythm.     Heart sounds: No murmur heard.    No friction rub. No gallop.  Pulmonary:     Effort: Pulmonary effort is normal. No respiratory distress.     Breath sounds: No wheezing, rhonchi or rales.  Musculoskeletal:        General: Tenderness (TTP over lateral epicondyles and extensor tendons of the forearm) present.  Skin:    General: Skin is warm and dry.  Neurological:     Mental Status: She is alert and oriented to person, place, and time.      Assessment & Plan:  Essential (primary) hypertension Assessment & Plan: BP goal <140/90.  Stable, at goal.  Continue lisinopril 5 mg daily.  Electrolytes and renal function within normal limits.  Will continue to monitor.   Mixed hyperlipidemia Assessment & Plan: Last lipid panel: LDL 105, HDL 58, triglycerides 177.  Slight increase in LDL and triglycerides.  Continue rosuvastatin 10 mg daily, encouraged to focus on nutritional and physical activity interventions.  Patient verbalized understanding and is agreeable to this plan.  Will continue to monitor.   Arthritis, lumbar spine -     Ambulatory referral to Orthopedic Surgery  Lateral epicondylitis of right elbow  Referring to orthopedics for lumbar arthritis.  Recommend conservative management for lateral epicondylitis with exercises, ibuprofen/Tylenol, a brace,  and heat/ice.  If no improvement after 1-2 months, see orthopedics.  Vitamin B12 and vitamin D within normal range.  Return in about 6 months (around 08/23/2024) for annual physical, fasting labs 1 week before.    Melida Quitter, PA

## 2024-02-24 NOTE — Assessment & Plan Note (Signed)
 BP goal <140/90.  Stable, at goal.  Continue lisinopril 5 mg daily.  Electrolytes and renal function within normal limits.  Will continue to monitor.

## 2024-02-24 NOTE — Patient Instructions (Addendum)
 PREVENTATIVE CARE: I encourage you to consider the following preventative care options that are recommended for you.  These are covered by your insurance company because they are for the prevention of issues that can be dangerous to your health. -Shingles vaccine: 2 dose series recommended for all adults starting at age 54. The risk of getting Shingles and of having complications from Shingles increases as we age. It is recommended to get the Shingles vaccine to protect yourself from Shingles by letting your body build an immune defense.  TENNIS ELBOW (LATERAL EPICONDYLITIS) If your pain has not improved after 1-2 months, please let us know! The next step will be to see orthopedics. -Take ibuprofen and/or Tylenol to reduce inflammation and increased pain. -Use ice to reduce inflammation and heat to improve pain daily -Buy an elbow brace to help support where the tendons connect.  You can find this online or at the pharmacy or grocery store. They look something like this:

## 2024-03-02 ENCOUNTER — Encounter: Payer: Self-pay | Admitting: Family Medicine

## 2024-05-13 ENCOUNTER — Telehealth: Payer: Self-pay | Admitting: *Deleted

## 2024-05-13 NOTE — Telephone Encounter (Signed)
 Lvm to call and schedule physical on or after 08/23/24 and FBW the week before

## 2024-06-01 ENCOUNTER — Telehealth: Payer: Self-pay | Admitting: *Deleted

## 2024-06-01 NOTE — Telephone Encounter (Signed)
 Copied from CRM [id number]. Topic: Appointments - Appointment Cancel/Reschedule >> Jun 01, 2024  1:53 PM Star [place] wrote: Patient needs to reschedule the physical but the system is not allowing me to do anything before February, please call patient [phone]

## 2024-06-01 NOTE — Telephone Encounter (Signed)
 Lvm for pt to call office to assist in rescheduling her physical and also the labs that were scheduled were for another office and we need to reschedule those.  If she calls back you can transfer her to the office so we can get this taken care of.

## 2024-06-07 NOTE — Telephone Encounter (Signed)
 Contacted pt and changed her physical to a later date and also her labs that were scheduled at the wrong office.

## 2024-06-11 LAB — HM MAMMOGRAPHY

## 2024-08-23 ENCOUNTER — Other Ambulatory Visit

## 2024-08-23 ENCOUNTER — Encounter

## 2024-09-10 ENCOUNTER — Other Ambulatory Visit: Payer: Self-pay

## 2024-09-10 DIAGNOSIS — E782 Mixed hyperlipidemia: Secondary | ICD-10-CM

## 2024-09-10 DIAGNOSIS — E559 Vitamin D deficiency, unspecified: Secondary | ICD-10-CM

## 2024-09-10 DIAGNOSIS — I10 Essential (primary) hypertension: Secondary | ICD-10-CM

## 2024-09-17 ENCOUNTER — Other Ambulatory Visit

## 2024-09-17 DIAGNOSIS — E782 Mixed hyperlipidemia: Secondary | ICD-10-CM

## 2024-09-17 DIAGNOSIS — E559 Vitamin D deficiency, unspecified: Secondary | ICD-10-CM

## 2024-09-17 DIAGNOSIS — I10 Essential (primary) hypertension: Secondary | ICD-10-CM

## 2024-09-18 LAB — CBC WITH DIFFERENTIAL/PLATELET
Basophils Absolute: 0 x10E3/uL (ref 0.0–0.2)
Basos: 1 %
EOS (ABSOLUTE): 0.1 x10E3/uL (ref 0.0–0.4)
Eos: 2 %
Hematocrit: 44.5 % (ref 34.0–46.6)
Hemoglobin: 14.7 g/dL (ref 11.1–15.9)
Immature Grans (Abs): 0 x10E3/uL (ref 0.0–0.1)
Immature Granulocytes: 0 %
Lymphocytes Absolute: 1.8 x10E3/uL (ref 0.7–3.1)
Lymphs: 30 %
MCH: 35.4 pg — ABNORMAL HIGH (ref 26.6–33.0)
MCHC: 33 g/dL (ref 31.5–35.7)
MCV: 107 fL — ABNORMAL HIGH (ref 79–97)
Monocytes Absolute: 0.5 x10E3/uL (ref 0.1–0.9)
Monocytes: 8 %
Neutrophils Absolute: 3.6 x10E3/uL (ref 1.4–7.0)
Neutrophils: 59 %
Platelets: 261 x10E3/uL (ref 150–450)
RBC: 4.15 x10E6/uL (ref 3.77–5.28)
RDW: 12.8 % (ref 11.7–15.4)
WBC: 6.1 x10E3/uL (ref 3.4–10.8)

## 2024-09-18 LAB — COMPREHENSIVE METABOLIC PANEL WITH GFR
ALT: 36 IU/L — ABNORMAL HIGH (ref 0–32)
AST: 36 IU/L (ref 0–40)
Albumin: 4.4 g/dL (ref 3.8–4.9)
Alkaline Phosphatase: 142 IU/L — ABNORMAL HIGH (ref 49–135)
BUN/Creatinine Ratio: 11 (ref 9–23)
BUN: 9 mg/dL (ref 6–24)
Bilirubin Total: 0.4 mg/dL (ref 0.0–1.2)
CO2: 22 mmol/L (ref 20–29)
Calcium: 9.5 mg/dL (ref 8.7–10.2)
Chloride: 107 mmol/L — ABNORMAL HIGH (ref 96–106)
Creatinine, Ser: 0.85 mg/dL (ref 0.57–1.00)
Globulin, Total: 2.1 g/dL (ref 1.5–4.5)
Glucose: 89 mg/dL (ref 70–99)
Potassium: 4.8 mmol/L (ref 3.5–5.2)
Sodium: 143 mmol/L (ref 134–144)
Total Protein: 6.5 g/dL (ref 6.0–8.5)
eGFR: 82 mL/min/1.73 (ref >59)

## 2024-09-18 LAB — LIPID PANEL
Chol/HDL Ratio: 3.2 ratio (ref 0.0–4.4)
Cholesterol, Total: 181 mg/dL (ref 100–199)
HDL: 57 mg/dL (ref >39)
LDL Chol Calc (NIH): 99 mg/dL (ref 0–99)
Triglycerides: 143 mg/dL (ref 0–149)
VLDL Cholesterol Cal: 25 mg/dL (ref 5–40)

## 2024-09-18 LAB — HEMOGLOBIN A1C
Est. average glucose Bld gHb Est-mCnc: 108 mg/dL
Hgb A1c MFr Bld: 5.4 % (ref 4.8–5.6)

## 2024-09-18 LAB — TSH: TSH: 1.91 u[IU]/mL (ref 0.450–4.500)

## 2024-09-18 LAB — VITAMIN D 25 HYDROXY (VIT D DEFICIENCY, FRACTURES): Vit D, 25-Hydroxy: 58.1 ng/mL (ref 30.0–100.0)

## 2024-09-19 ENCOUNTER — Ambulatory Visit: Payer: Self-pay

## 2024-09-24 ENCOUNTER — Ambulatory Visit (INDEPENDENT_AMBULATORY_CARE_PROVIDER_SITE_OTHER)

## 2024-09-24 VITALS — BP 108/77 | HR 88 | Temp 98.0°F | Ht 67.0 in | Wt 190.0 lb

## 2024-09-24 DIAGNOSIS — F17200 Nicotine dependence, unspecified, uncomplicated: Secondary | ICD-10-CM

## 2024-09-24 DIAGNOSIS — E663 Overweight: Secondary | ICD-10-CM

## 2024-09-24 DIAGNOSIS — Z0000 Encounter for general adult medical examination without abnormal findings: Secondary | ICD-10-CM

## 2024-09-24 DIAGNOSIS — E7800 Pure hypercholesterolemia, unspecified: Secondary | ICD-10-CM | POA: Diagnosis not present

## 2024-09-24 DIAGNOSIS — Z6827 Body mass index (BMI) 27.0-27.9, adult: Secondary | ICD-10-CM

## 2024-09-24 DIAGNOSIS — E782 Mixed hyperlipidemia: Secondary | ICD-10-CM

## 2024-09-24 DIAGNOSIS — R718 Other abnormality of red blood cells: Secondary | ICD-10-CM | POA: Diagnosis not present

## 2024-09-24 DIAGNOSIS — I10 Essential (primary) hypertension: Secondary | ICD-10-CM | POA: Diagnosis not present

## 2024-09-24 DIAGNOSIS — E559 Vitamin D deficiency, unspecified: Secondary | ICD-10-CM

## 2024-09-24 MED ORDER — LISINOPRIL 5 MG PO TABS: 5.0000 mg | ORAL_TABLET | Freq: Every day | ORAL | 1 refills | Status: DC

## 2024-09-24 MED ORDER — ROSUVASTATIN CALCIUM 10 MG PO TABS: 10.0000 mg | ORAL_TABLET | Freq: Every day | ORAL | 1 refills | Status: DC

## 2024-09-24 MED ORDER — PHENTERMINE HCL 15 MG PO CAPS: 15.0000 mg | ORAL_CAPSULE | ORAL | 2 refills | Status: DC

## 2024-09-24 NOTE — Assessment & Plan Note (Signed)
 BP goal <140/90.  Stable, at goal.  Continue lisinopril 5 mg daily.  Electrolytes and renal function within normal limits.  Will continue to monitor.

## 2024-09-24 NOTE — Patient Instructions (Signed)
 VISIT SUMMARY: Today, you had your annual physical exam and we discussed several health concerns including your elevated mean corpuscular volume (MCV), liver function, weight management, hypertension, hyperlipidemia, and tobacco use.  YOUR PLAN: -ELEVATED MEAN CORPUSCULAR VOLUME (MCV): Your blood test showed a slightly elevated MCV, which can be due to various reasons including undiagnosed sleep apnea, smoking, or vitamin B12 or folate deficiency. Since your B12 levels are normal and smoking is unlikely the sole cause, we suspect sleep apnea due to your snoring and family history. We will order a home sleep study to evaluate this further.  -ELEVATED LIVER FUNCTION TESTS: Your liver function tests are slightly elevated, likely due to your known gallstones. We will continue to monitor this.  -OBESITY: You are interested in weight loss options. Since your insurance does not cover GLP-1 injections, we discussed using phentermine  as an alternative. We will start with a 15 mg dose and monitor for side effects and effectiveness, increasing to 30 mg if needed.  -HYPERTENSION: Your blood pressure is well controlled with your current medication, lisinopril . We will continue with this treatment and refill your prescription.  -HYPERLIPIDEMIA: Your cholesterol levels are well controlled with rosuvastatin , and you have not reported any side effects. We will continue with this treatment and refill your prescription.  -TOBACCO USE: You currently smoke about half a pack of cigarettes per day. While smoking may contribute to your elevated MCV, it is unlikely the sole cause. We encourage you to consider quitting smoking for your overall health.  INSTRUCTIONS: We will order a home sleep study to evaluate for sleep apnea. Please monitor for any side effects from the new weight loss medication, phentermine , and report them to us . We will also continue to monitor your liver function and manage your hypertension and  cholesterol with your current medications. Follow up with us  if you have any concerns or notice any changes in your health.  If you have any problems before your next visit feel free to message me via MyChart (minor issues or questions) or call the office, otherwise you may reach out to schedule an office visit.  Thank you! Saddie Sacks, PA-C

## 2024-09-24 NOTE — Assessment & Plan Note (Signed)
 Chronic slightly elevated MCV. Differential includes undiagnosed sleep apnea, smoking, and vitamin B12 or folate deficiency. B12 levels normal. Smoking unlikely significant cause. Possible sleep apnea due to snoring and family history. Discussed preference for home sleep study. - Order home sleep study to evaluate for sleep apnea.

## 2024-09-24 NOTE — Progress Notes (Signed)
 Complete physical exam  Patient: Allison Wallace   DOB: [birth year]   53 y.o. Female  MRN: [id number]  Subjective:    Chief Complaint  Patient presents with   Annual Exam    Physical    History of Present Illness   ANNISSA Wallace is a 53 year old female who presents for an annual physical exam and medication refills.  Elevated mean corpuscular volume (mcv) - Elevated MCV on blood tests - No prior hematology evaluation  Tobacco use and sleep-related symptoms - Smokes approximately half a pack of cigarettes per day - Snoring present - No diagnosis of sleep apnea - Mother with history of sleep apnea  Gallstones and hepatobiliary findings - Gallstones identified on liver ultrasound in 2023 - Mildly elevated liver function tests - Multiple prior evaluations of liver and gallbladder  Hyperlipidemia - Currently taking rosuvastatin  for cholesterol management - No side effects from rosuvastatin  - Cholesterol levels well-controlled  Hypertension - On low dose lisinopril  for blood pressure control - Blood pressure effectively managed  Scalp indentations - Indentations on scalp attributed to frequent placement of reading glasses on head - No associated skin changes  Weight management - Interested in weight loss options - No prior use of weight loss medications  Bowel habits - Regular bowel movements          Most recent fall risk assessment:    09/24/2024    9:17 AM  Fall Risk   Falls in the past year? 0  Follow up Falls evaluation completed     Most recent depression screenings:    09/24/2024    9:17 AM 02/24/2024    8:23 AM  PHQ 2/9 Scores  PHQ - 2 Score 0 0  PHQ- 9 Score  0        Patient Care Team: Gayle Saddie JULIANNA DEVONNA as PCP - General (Physician Assistant) Shona Rush, MD (Dermatology) Lenon Oneil BRAVO, MD as Consulting Physician (Obstetrics and Gynecology)   Outpatient Medications Prior to Visit  Medication Sig   calcium  elemental as  carbonate (BARIATRIC TUMS ULTRA) 400 MG chewable tablet Chew 2,000-3,000 mg by mouth as needed for heartburn.   Multiple Vitamin (ONE-A-DAY ADULT VITACRAVES+DHA) CHEW Chew by mouth.   [DISCONTINUED] lisinopril  (ZESTRIL ) 5 MG tablet TAKE 1 TABLET (5 MG TOTAL) BY MOUTH DAILY.   [DISCONTINUED] rosuvastatin  (CRESTOR ) 10 MG tablet TAKE 1 TABLET BY MOUTH EVERY DAY   No facility-administered medications prior to visit.    ROS  Per HPI      Objective:     BP 108/77   Pulse 88   Temp 98 F (36.7 C) (Oral)   Ht 5' 7 (1.702 m)   Wt 190 lb (86.2 kg)   LMP 05/30/2022 (Within Weeks)   SpO2 97%   BMI 29.76 kg/m    Physical Exam Constitutional:      General: She is not in acute distress.    Appearance: Normal appearance.  HENT:     Right Ear: Tympanic membrane normal.     Left Ear: Tympanic membrane normal.     Mouth/Throat:     Mouth: Mucous membranes are moist.     Pharynx: Oropharynx is clear.  Eyes:     Pupils: Pupils are equal, round, and reactive to light.  Cardiovascular:     Rate and Rhythm: Normal rate and regular rhythm.     Heart sounds: Normal heart sounds. No murmur heard.    No friction rub. No gallop.  Pulmonary:  Effort: Pulmonary effort is normal. No respiratory distress.     Breath sounds: Normal breath sounds.  Abdominal:     General: Abdomen is flat. Bowel sounds are normal.     Palpations: Abdomen is soft.  Musculoskeletal:        General: No swelling.     Cervical back: Normal range of motion.  Lymphadenopathy:     Cervical: No cervical adenopathy.  Skin:    General: Skin is warm and dry.  Neurological:     General: No focal deficit present.     Mental Status: She is alert.  Psychiatric:        Mood and Affect: Mood normal.        Behavior: Behavior normal.        Thought Content: Thought content normal.      No results found for any visits on 09/24/24. Last CBC Lab Results  Component Value Date   WBC 6.1 09/17/2024   HGB 14.7  09/17/2024   HCT 44.5 09/17/2024   MCV 107 (H) 09/17/2024   MCH 35.4 (H) 09/17/2024   RDW 12.8 09/17/2024   PLT 261 09/17/2024   Last metabolic panel Lab Results  Component Value Date   GLUCOSE 89 09/17/2024   NA 143 09/17/2024   K 4.8 09/17/2024   CL 107 (H) 09/17/2024   CO2 22 09/17/2024   BUN 9 09/17/2024   CREATININE 0.85 09/17/2024   EGFR 82 09/17/2024   CALCIUM  9.5 09/17/2024   PROT 6.5 09/17/2024   ALBUMIN 4.4 09/17/2024   LABGLOB 2.1 09/17/2024   AGRATIO 2.3 (H) 02/13/2023   BILITOT 0.4 09/17/2024   ALKPHOS 142 (H) 09/17/2024   AST 36 09/17/2024   ALT 36 (H) 09/17/2024   Last lipids Lab Results  Component Value Date   CHOL 181 09/17/2024   HDL 57 09/17/2024   LDLCALC 99 09/17/2024   TRIG 143 09/17/2024   CHOLHDL 3.2 09/17/2024   Last hemoglobin A1c Lab Results  Component Value Date   HGBA1C 5.4 09/17/2024   Last thyroid functions Lab Results  Component Value Date   TSH 1.910 09/17/2024   Last vitamin D  Lab Results  Component Value Date   VD25OH 58.1 09/17/2024        Assessment & Plan:    Routine Health Maintenance and Physical Exam  Health Maintenance  Topic Date Due   Hepatitis B Vaccine (1 of 3 - 19+ 3-dose series) Never done   Zoster (Shingles) Vaccine (1 of 2) Never done   Breast Cancer Screening  04/21/2023   Flu Shot  07/30/2024   COVID-19 Vaccine (3 - 2025-26 season) 08/30/2024   Pap with HPV screening  04/20/2026   DTaP/Tdap/Td vaccine (2 - Td or Tdap) 07/06/2028   Colon Cancer Screening  04/19/2031   Pneumococcal Vaccine for age over 36  Completed   Hepatitis C Screening  Completed   HIV Screening  Completed   HPV Vaccine  Aged Out   Meningitis B Vaccine  Aged Out    Discussed health benefits of physical activity, and encouraged her to engage in regular exercise appropriate for her age and condition.  Elevated MCV Assessment & Plan: Chronic slightly elevated MCV. Differential includes undiagnosed sleep apnea, smoking,  and vitamin B12 or folate deficiency. B12 levels normal. Smoking unlikely significant cause. Possible sleep apnea due to snoring and family history. Discussed preference for home sleep study. - Order home sleep study to evaluate for sleep apnea.  Orders: -  Home sleep test  Elevated LDL cholesterol level -     Lisinopril ; Take 1 tablet (5 mg total) by mouth daily.  Dispense: 90 tablet; Refill: 1 -     Rosuvastatin  Calcium ; Take 1 tablet (10 mg total) by mouth daily.  Dispense: 90 tablet; Refill: 1  HTN, goal below 130/80 -     Lisinopril ; Take 1 tablet (5 mg total) by mouth daily.  Dispense: 90 tablet; Refill: 1  Overweight with body mass index (BMI) of 27 to 27.9 in adult Assessment & Plan: Obesity with interest in pharmacologic weight management. Insurance does not cover GLP-1 injections. Discussed phentermine  as an alternative, including potential side effects and typical weight loss. Plan to start with 15 mg dose and increase if needed. - Prescribe phentermine  15 mg. - Monitor for side effects and efficacy. - Increase dose to 30 mg if 15 mg becomes less effective.   Vitamin D  deficiency Assessment & Plan: Discussed taking daily maintenance dosing of vitamin D . Continue daily 2000 U over-the-counter vitamin D3.    Mixed hyperlipidemia Assessment & Plan: Last lipid panel: LDL 99, HDL 57, Trig 143. The 10-year ASCVD risk score (Arnett DK, et al., 2019) is: 3.5% Continue rosuvastatin  10 mg daily.    Essential (primary) hypertension Assessment & Plan: BP goal <140/90.  Stable, at goal.  Continue lisinopril  5 mg daily.  Electrolytes and renal function within normal limits.  Will continue to monitor.    Tobacco use disorder Assessment & Plan: 1/2 pack per day Declined smoking cessation today   Other orders -     Phentermine  HCl; Take 1 capsule (15 mg total) by mouth every morning.  Dispense: 30 capsule; Refill: 2  Return in about 3 months (around 12/24/2024) for  Weight.     Saddie JULIANNA Sacks, PA-C

## 2024-09-24 NOTE — Assessment & Plan Note (Signed)
1/2 pack per day Declined smoking cessation today

## 2024-09-24 NOTE — Assessment & Plan Note (Signed)
 Obesity with interest in pharmacologic weight management. Insurance does not cover GLP-1 injections. Discussed phentermine  as an alternative, including potential side effects and typical weight loss. Plan to start with 15 mg dose and increase if needed. - Prescribe phentermine  15 mg. - Monitor for side effects and efficacy. - Increase dose to 30 mg if 15 mg becomes less effective.

## 2024-09-24 NOTE — Assessment & Plan Note (Signed)
 Last lipid panel: LDL 99, HDL 57, Trig 143. The 10-year ASCVD risk score (Arnett DK, et al., 2019) is: 3.5% Continue rosuvastatin  10 mg daily.

## 2024-09-24 NOTE — Assessment & Plan Note (Signed)
 Discussed taking daily maintenance dosing of vitamin D . Continue daily 2000 U over-the-counter vitamin D3.

## 2024-12-27 ENCOUNTER — Ambulatory Visit

## 2024-12-27 VITALS — BP 135/88 | HR 80 | Temp 98.3°F | Ht 67.0 in | Wt 187.0 lb

## 2024-12-27 DIAGNOSIS — R718 Other abnormality of red blood cells: Secondary | ICD-10-CM

## 2024-12-27 DIAGNOSIS — I10 Essential (primary) hypertension: Secondary | ICD-10-CM | POA: Diagnosis not present

## 2024-12-27 DIAGNOSIS — E663 Overweight: Secondary | ICD-10-CM | POA: Diagnosis not present

## 2024-12-27 DIAGNOSIS — E782 Mixed hyperlipidemia: Secondary | ICD-10-CM | POA: Diagnosis not present

## 2024-12-27 DIAGNOSIS — E7800 Pure hypercholesterolemia, unspecified: Secondary | ICD-10-CM | POA: Diagnosis not present

## 2024-12-27 DIAGNOSIS — Z6827 Body mass index (BMI) 27.0-27.9, adult: Secondary | ICD-10-CM | POA: Diagnosis not present

## 2024-12-27 MED ORDER — ROSUVASTATIN CALCIUM 10 MG PO TABS: 10.0000 mg | ORAL_TABLET | Freq: Every day | ORAL | 1 refills | Status: AC

## 2024-12-27 MED ORDER — LISINOPRIL 5 MG PO TABS: 5.0000 mg | ORAL_TABLET | Freq: Every day | ORAL | 1 refills | Status: AC

## 2024-12-27 NOTE — Progress Notes (Signed)
 "  Established Patient Office Visit  Subjective   Patient ID: Allison Wallace, female    DOB: [birth year]  Age: 53 y.o. MRN: [id number]  Chief Complaint  Patient presents with   Medication Management    HPI  History of Present Illness   Allison Wallace is a 53 year old female who presents for follow-up on weight management and medication refills.  Weight management - Current weight is 186-187 pounds, decreased from 190 pounds at last visit. - Weight maintained or reduced over the holiday season. - Weight management strategies include reduced food intake, use of herbal weight shakes, and decreased consumption of sugary drinks. - Uses a sugar-free drink called 'kick' for hydration and energy. - Discontinued phentermine  after two or three doses due to concerns after reading about the medication. - No adverse side effects experienced from phentermine  when she did take it for 2-3 days.   Obstructive sleep apnea - Unable to obtain sleep apnea equipment despite receiving a letter on October 6th indicating information was received and she would be contacted for shipment. - No contact received since the letter despite calling the provided number.          ROS Per HPI.    Objective:     BP 135/88   Pulse 80   Temp 98.3 F (36.8 C) (Oral)   Ht 5' 7 (1.702 m)   Wt 187 lb 0.6 oz (84.8 kg)   LMP 05/30/2022   SpO2 99%   BMI 29.29 kg/m    Physical Exam Constitutional:      General: She is not in acute distress.    Appearance: Normal appearance.  Cardiovascular:     Rate and Rhythm: Normal rate and regular rhythm.     Heart sounds: Normal heart sounds. No murmur heard.    No friction rub. No gallop.  Pulmonary:     Effort: Pulmonary effort is normal. No respiratory distress.     Breath sounds: Normal breath sounds.  Musculoskeletal:        General: No swelling.     Cervical back: Neck supple.  Lymphadenopathy:     Cervical: No cervical adenopathy.  Skin:     General: Skin is warm and dry.  Neurological:     General: No focal deficit present.     Mental Status: She is alert.  Psychiatric:        Mood and Affect: Mood normal.        Behavior: Behavior normal.        Thought Content: Thought content normal.      No results found for any visits on 12/27/24.  Last CBC Lab Results  Component Value Date   WBC 6.1 09/17/2024   HGB 14.7 09/17/2024   HCT 44.5 09/17/2024   MCV 107 (H) 09/17/2024   MCH 35.4 (H) 09/17/2024   RDW 12.8 09/17/2024   PLT 261 09/17/2024   Last metabolic panel Lab Results  Component Value Date   GLUCOSE 89 09/17/2024   NA 143 09/17/2024   K 4.8 09/17/2024   CL 107 (H) 09/17/2024   CO2 22 09/17/2024   BUN 9 09/17/2024   CREATININE 0.85 09/17/2024   EGFR 82 09/17/2024   CALCIUM  9.5 09/17/2024   PROT 6.5 09/17/2024   ALBUMIN 4.4 09/17/2024   LABGLOB 2.1 09/17/2024   AGRATIO 2.3 (H) 02/13/2023   BILITOT 0.4 09/17/2024   ALKPHOS 142 (H) 09/17/2024   AST 36 09/17/2024   ALT 36 (H) 09/17/2024  Last lipids Lab Results  Component Value Date   CHOL 181 09/17/2024   HDL 57 09/17/2024   LDLCALC 99 09/17/2024   TRIG 143 09/17/2024   CHOLHDL 3.2 09/17/2024   Last hemoglobin A1c Lab Results  Component Value Date   HGBA1C 5.4 09/17/2024   Last thyroid functions Lab Results  Component Value Date   TSH 1.910 09/17/2024   Last vitamin D  Lab Results  Component Value Date   VD25OH 58.1 09/17/2024      The 10-year ASCVD risk score (Arnett DK, et al., 2019) is: 5.8%    Assessment & Plan:   Elevated MCV Assessment & Plan: Checking B12 levels today with labs as they have been low-normal in the past. Will follow up with [facility] regarding sleep study equipment for further investigation. If B12 and sleep study are normal, MCV elevation is likely due to smoking.   Orders: -     Vitamin B12; Future -     CBC with Differential/Platelet; Future  Elevated LDL cholesterol level -      Rosuvastatin  Calcium ; Take 1 tablet (10 mg total) by mouth daily.  Dispense: 90 tablet; Refill: 1 -     Lisinopril ; Take 1 tablet (5 mg total) by mouth daily.  Dispense: 90 tablet; Refill: 1  HTN, goal below 130/80 -     Lisinopril ; Take 1 tablet (5 mg total) by mouth daily.  Dispense: 90 tablet; Refill: 1  Mixed hyperlipidemia -     Comprehensive metabolic panel with GFR; Future  Overweight with body mass index (BMI) of 27 to 27.9 in adult Assessment & Plan: Ongoing management with 3-pound weight loss due to dietary changes and herbal shakes. Phentermine  not pursued due to side effect concerns she read about.  - Continue current weight management strategies. - Consider phentermine  if weight loss plateaus.   Essential (primary) hypertension Assessment & Plan: BP goal <140/90. Stable, at goal. Continue lisinopril  5 mg daily. Electrolytes and renal function within normal limits. Will continue to monitor.       Return in about 9 months (around 09/27/2025) for Physical.    Saddie JULIANNA Sacks, PA-C "

## 2024-12-27 NOTE — Assessment & Plan Note (Signed)
 Checking B12 levels today with labs as they have been low-normal in the past. Will follow up with [facility] regarding sleep study equipment for further investigation. If B12 and sleep study are normal, MCV elevation is likely due to smoking.

## 2024-12-27 NOTE — Assessment & Plan Note (Signed)
 Ongoing management with 3-pound weight loss due to dietary changes and herbal shakes. Phentermine  not pursued due to side effect concerns she read about.  - Continue current weight management strategies. - Consider phentermine  if weight loss plateaus.

## 2024-12-27 NOTE — Patient Instructions (Signed)
 VISIT SUMMARY: Today, we discussed your progress with weight management, refilled your medications for blood pressure and cholesterol, and addressed the issue with your sleep apnea equipment.  YOUR PLAN: OBESITY: You have lost 3 pounds since your last visit by making dietary changes and using herbal shakes. You stopped taking phentermine  due to concerns about the medication. -Continue with your current weight management strategies, including reduced food intake, herbal weight shakes, and decreased sugary drinks. -Consider using phentermine  if your weight loss plateaus.  ESSENTIAL HYPERTENSION: Your blood pressure is well-controlled with your current medication. -Your blood pressure medication has been refilled at [facility] on [address].  HYPERCHOLESTEROLEMIA: Your cholesterol levels are managed with your current medication. -Your cholesterol medication has been refilled at [facility] on [address].  OBSTRUCTIVE SLEEP APNEA: You have not received your sleep apnea equipment despite previous communication. -We have contacted the equipment provider to resolve the issue and ensure you receive your equipment.  If you have any problems before your next visit feel free to message me via MyChart (minor issues or questions) or call the office, otherwise you may reach out to schedule an office visit.  Thank you! Saddie Sacks, PA-C

## 2024-12-27 NOTE — Assessment & Plan Note (Signed)
 BP goal <140/90.  Stable, at goal.  Continue lisinopril 5 mg daily.  Electrolytes and renal function within normal limits.  Will continue to monitor.

## 2024-12-28 ENCOUNTER — Ambulatory Visit: Payer: Self-pay

## 2024-12-28 LAB — CBC WITH DIFFERENTIAL/PLATELET
Basophils Absolute: 0 x10E3/uL (ref 0.0–0.2)
Basos: 0 %
EOS (ABSOLUTE): 0.1 x10E3/uL (ref 0.0–0.4)
Eos: 2 %
Hematocrit: 42 % (ref 34.0–46.6)
Hemoglobin: 13.9 g/dL (ref 11.1–15.9)
Immature Grans (Abs): 0 x10E3/uL (ref 0.0–0.1)
Immature Granulocytes: 0 %
Lymphocytes Absolute: 2.1 x10E3/uL (ref 0.7–3.1)
Lymphs: 30 %
MCH: 34.3 pg — ABNORMAL HIGH (ref 26.6–33.0)
MCHC: 33.1 g/dL (ref 31.5–35.7)
MCV: 104 fL — ABNORMAL HIGH (ref 79–97)
Monocytes Absolute: 0.6 x10E3/uL (ref 0.1–0.9)
Monocytes: 8 %
Neutrophils Absolute: 4.2 x10E3/uL (ref 1.4–7.0)
Neutrophils: 60 %
Platelets: 291 x10E3/uL (ref 150–450)
RBC: 4.05 x10E6/uL (ref 3.77–5.28)
RDW: 11.8 % (ref 11.7–15.4)
WBC: 7 x10E3/uL (ref 3.4–10.8)

## 2024-12-28 LAB — COMPREHENSIVE METABOLIC PANEL WITH GFR
ALT: 23 IU/L (ref 0–32)
AST: 24 IU/L (ref 0–40)
Albumin: 4.4 g/dL (ref 3.8–4.9)
Alkaline Phosphatase: 103 IU/L (ref 49–135)
BUN/Creatinine Ratio: 13 (ref 9–23)
BUN: 10 mg/dL (ref 6–24)
Bilirubin Total: 0.2 mg/dL (ref 0.0–1.2)
CO2: 23 mmol/L (ref 20–29)
Calcium: 9.8 mg/dL (ref 8.7–10.2)
Chloride: 105 mmol/L (ref 96–106)
Creatinine, Ser: 0.75 mg/dL (ref 0.57–1.00)
Globulin, Total: 2 g/dL (ref 1.5–4.5)
Glucose: 76 mg/dL (ref 70–99)
Potassium: 5 mmol/L (ref 3.5–5.2)
Sodium: 142 mmol/L (ref 134–144)
Total Protein: 6.4 g/dL (ref 6.0–8.5)
eGFR: 95 mL/min/1.73

## 2024-12-28 LAB — VITAMIN B12: Vitamin B-12: 430 pg/mL (ref 232–1245)

## 2025-01-26 ENCOUNTER — Telehealth: Payer: Self-pay | Admitting: *Deleted

## 2025-01-26 NOTE — Telephone Encounter (Signed)
 Received fax stating that [facility] has not been able to reach pt and complete registration. Will send a mychart message requesting her to reach out to them.

## 2025-09-20 ENCOUNTER — Other Ambulatory Visit

## 2025-09-27 ENCOUNTER — Encounter
